# Patient Record
Sex: Female | Born: 1940 | Race: White | Hispanic: No | Smoking: Former smoker
Health system: Southern US, Community
[De-identification: ages and names within clinical notes are randomized; demographics above are authoritative.]

## PROBLEM LIST (undated history)

## (undated) DIAGNOSIS — N189 Chronic kidney disease, unspecified: Secondary | ICD-10-CM

## (undated) DIAGNOSIS — E119 Type 2 diabetes mellitus without complications: Secondary | ICD-10-CM

## (undated) DIAGNOSIS — E039 Hypothyroidism, unspecified: Secondary | ICD-10-CM

## (undated) DIAGNOSIS — I10 Essential (primary) hypertension: Secondary | ICD-10-CM

## (undated) DIAGNOSIS — I499 Cardiac arrhythmia, unspecified: Secondary | ICD-10-CM

## (undated) DIAGNOSIS — I509 Heart failure, unspecified: Secondary | ICD-10-CM

---

## 2011-12-01 DIAGNOSIS — L03119 Cellulitis of unspecified part of limb: Secondary | ICD-10-CM | POA: Diagnosis not present

## 2011-12-01 DIAGNOSIS — Z6841 Body Mass Index (BMI) 40.0 and over, adult: Secondary | ICD-10-CM | POA: Diagnosis not present

## 2011-12-01 DIAGNOSIS — L02419 Cutaneous abscess of limb, unspecified: Secondary | ICD-10-CM | POA: Diagnosis not present

## 2011-12-14 DIAGNOSIS — Z7901 Long term (current) use of anticoagulants: Secondary | ICD-10-CM | POA: Diagnosis not present

## 2012-01-03 DIAGNOSIS — Z1231 Encounter for screening mammogram for malignant neoplasm of breast: Secondary | ICD-10-CM | POA: Diagnosis not present

## 2012-01-16 DIAGNOSIS — Z79899 Other long term (current) drug therapy: Secondary | ICD-10-CM | POA: Diagnosis not present

## 2012-01-16 DIAGNOSIS — E119 Type 2 diabetes mellitus without complications: Secondary | ICD-10-CM | POA: Diagnosis not present

## 2012-01-16 DIAGNOSIS — I4891 Unspecified atrial fibrillation: Secondary | ICD-10-CM | POA: Diagnosis not present

## 2012-01-16 DIAGNOSIS — E785 Hyperlipidemia, unspecified: Secondary | ICD-10-CM | POA: Diagnosis not present

## 2012-01-16 DIAGNOSIS — I10 Essential (primary) hypertension: Secondary | ICD-10-CM | POA: Diagnosis not present

## 2012-01-16 DIAGNOSIS — Z6841 Body Mass Index (BMI) 40.0 and over, adult: Secondary | ICD-10-CM | POA: Diagnosis not present

## 2012-01-16 DIAGNOSIS — Z7901 Long term (current) use of anticoagulants: Secondary | ICD-10-CM | POA: Diagnosis not present

## 2012-01-16 DIAGNOSIS — IMO0001 Reserved for inherently not codable concepts without codable children: Secondary | ICD-10-CM | POA: Diagnosis not present

## 2012-02-16 DIAGNOSIS — Z7901 Long term (current) use of anticoagulants: Secondary | ICD-10-CM | POA: Diagnosis not present

## 2012-03-20 DIAGNOSIS — Z7901 Long term (current) use of anticoagulants: Secondary | ICD-10-CM | POA: Diagnosis not present

## 2012-04-19 DIAGNOSIS — D485 Neoplasm of uncertain behavior of skin: Secondary | ICD-10-CM | POA: Diagnosis not present

## 2012-04-19 DIAGNOSIS — D2310 Other benign neoplasm of skin of unspecified eyelid, including canthus: Secondary | ICD-10-CM | POA: Diagnosis not present

## 2012-04-20 DIAGNOSIS — D492 Neoplasm of unspecified behavior of bone, soft tissue, and skin: Secondary | ICD-10-CM | POA: Diagnosis not present

## 2012-04-20 DIAGNOSIS — D485 Neoplasm of uncertain behavior of skin: Secondary | ICD-10-CM | POA: Diagnosis not present

## 2012-04-23 DIAGNOSIS — Z7901 Long term (current) use of anticoagulants: Secondary | ICD-10-CM | POA: Diagnosis not present

## 2012-04-23 DIAGNOSIS — Z6841 Body Mass Index (BMI) 40.0 and over, adult: Secondary | ICD-10-CM | POA: Diagnosis not present

## 2012-04-23 DIAGNOSIS — I10 Essential (primary) hypertension: Secondary | ICD-10-CM | POA: Diagnosis not present

## 2012-04-23 DIAGNOSIS — E785 Hyperlipidemia, unspecified: Secondary | ICD-10-CM | POA: Diagnosis not present

## 2012-04-23 DIAGNOSIS — I4891 Unspecified atrial fibrillation: Secondary | ICD-10-CM | POA: Diagnosis not present

## 2012-05-14 DIAGNOSIS — M949 Disorder of cartilage, unspecified: Secondary | ICD-10-CM | POA: Diagnosis not present

## 2012-05-14 DIAGNOSIS — M25569 Pain in unspecified knee: Secondary | ICD-10-CM | POA: Diagnosis not present

## 2012-05-14 DIAGNOSIS — N951 Menopausal and female climacteric states: Secondary | ICD-10-CM | POA: Diagnosis not present

## 2012-05-14 DIAGNOSIS — M5137 Other intervertebral disc degeneration, lumbosacral region: Secondary | ICD-10-CM | POA: Diagnosis not present

## 2012-05-14 DIAGNOSIS — M47817 Spondylosis without myelopathy or radiculopathy, lumbosacral region: Secondary | ICD-10-CM | POA: Diagnosis not present

## 2012-05-14 DIAGNOSIS — M169 Osteoarthritis of hip, unspecified: Secondary | ICD-10-CM | POA: Diagnosis not present

## 2012-05-14 DIAGNOSIS — M899 Disorder of bone, unspecified: Secondary | ICD-10-CM | POA: Diagnosis not present

## 2012-05-25 DIAGNOSIS — R791 Abnormal coagulation profile: Secondary | ICD-10-CM | POA: Diagnosis not present

## 2012-05-29 DIAGNOSIS — M1610 Unilateral primary osteoarthritis, unspecified hip: Secondary | ICD-10-CM | POA: Diagnosis not present

## 2012-05-29 DIAGNOSIS — M5137 Other intervertebral disc degeneration, lumbosacral region: Secondary | ICD-10-CM | POA: Diagnosis not present

## 2012-05-29 DIAGNOSIS — M4310 Spondylolisthesis, site unspecified: Secondary | ICD-10-CM | POA: Diagnosis not present

## 2012-06-01 DIAGNOSIS — R791 Abnormal coagulation profile: Secondary | ICD-10-CM | POA: Diagnosis not present

## 2012-06-15 DIAGNOSIS — Z7901 Long term (current) use of anticoagulants: Secondary | ICD-10-CM | POA: Diagnosis not present

## 2012-07-12 DIAGNOSIS — H524 Presbyopia: Secondary | ICD-10-CM | POA: Diagnosis not present

## 2012-07-12 DIAGNOSIS — E119 Type 2 diabetes mellitus without complications: Secondary | ICD-10-CM | POA: Diagnosis not present

## 2012-07-17 DIAGNOSIS — Z7901 Long term (current) use of anticoagulants: Secondary | ICD-10-CM | POA: Diagnosis not present

## 2012-07-31 DIAGNOSIS — E785 Hyperlipidemia, unspecified: Secondary | ICD-10-CM | POA: Diagnosis not present

## 2012-07-31 DIAGNOSIS — I4891 Unspecified atrial fibrillation: Secondary | ICD-10-CM | POA: Diagnosis not present

## 2012-07-31 DIAGNOSIS — I10 Essential (primary) hypertension: Secondary | ICD-10-CM | POA: Diagnosis not present

## 2012-07-31 DIAGNOSIS — IMO0001 Reserved for inherently not codable concepts without codable children: Secondary | ICD-10-CM | POA: Diagnosis not present

## 2012-08-01 DIAGNOSIS — M109 Gout, unspecified: Secondary | ICD-10-CM | POA: Diagnosis not present

## 2012-08-01 DIAGNOSIS — I509 Heart failure, unspecified: Secondary | ICD-10-CM | POA: Diagnosis not present

## 2012-08-01 DIAGNOSIS — I4891 Unspecified atrial fibrillation: Secondary | ICD-10-CM | POA: Diagnosis not present

## 2012-08-09 DIAGNOSIS — I4891 Unspecified atrial fibrillation: Secondary | ICD-10-CM | POA: Diagnosis not present

## 2012-08-09 DIAGNOSIS — I509 Heart failure, unspecified: Secondary | ICD-10-CM | POA: Diagnosis not present

## 2012-08-09 DIAGNOSIS — M109 Gout, unspecified: Secondary | ICD-10-CM | POA: Diagnosis not present

## 2012-08-14 DIAGNOSIS — G4710 Hypersomnia, unspecified: Secondary | ICD-10-CM | POA: Diagnosis not present

## 2012-08-15 DIAGNOSIS — G4733 Obstructive sleep apnea (adult) (pediatric): Secondary | ICD-10-CM | POA: Diagnosis not present

## 2012-08-16 DIAGNOSIS — M109 Gout, unspecified: Secondary | ICD-10-CM | POA: Diagnosis not present

## 2012-08-16 DIAGNOSIS — I4891 Unspecified atrial fibrillation: Secondary | ICD-10-CM | POA: Diagnosis not present

## 2012-08-16 DIAGNOSIS — I509 Heart failure, unspecified: Secondary | ICD-10-CM | POA: Diagnosis not present

## 2012-08-17 DIAGNOSIS — M109 Gout, unspecified: Secondary | ICD-10-CM | POA: Diagnosis not present

## 2012-08-17 DIAGNOSIS — I4891 Unspecified atrial fibrillation: Secondary | ICD-10-CM | POA: Diagnosis not present

## 2012-08-17 DIAGNOSIS — I509 Heart failure, unspecified: Secondary | ICD-10-CM | POA: Diagnosis not present

## 2012-08-24 DIAGNOSIS — I4891 Unspecified atrial fibrillation: Secondary | ICD-10-CM | POA: Diagnosis not present

## 2012-08-24 DIAGNOSIS — M109 Gout, unspecified: Secondary | ICD-10-CM | POA: Diagnosis not present

## 2012-08-24 DIAGNOSIS — I509 Heart failure, unspecified: Secondary | ICD-10-CM | POA: Diagnosis not present

## 2012-08-28 DIAGNOSIS — G4730 Sleep apnea, unspecified: Secondary | ICD-10-CM | POA: Diagnosis not present

## 2012-08-28 DIAGNOSIS — G4710 Hypersomnia, unspecified: Secondary | ICD-10-CM | POA: Diagnosis not present

## 2012-08-29 DIAGNOSIS — G4733 Obstructive sleep apnea (adult) (pediatric): Secondary | ICD-10-CM | POA: Diagnosis not present

## 2012-08-31 DIAGNOSIS — M109 Gout, unspecified: Secondary | ICD-10-CM | POA: Diagnosis not present

## 2012-08-31 DIAGNOSIS — I4891 Unspecified atrial fibrillation: Secondary | ICD-10-CM | POA: Diagnosis not present

## 2012-08-31 DIAGNOSIS — I509 Heart failure, unspecified: Secondary | ICD-10-CM | POA: Diagnosis not present

## 2012-09-14 DIAGNOSIS — Z7901 Long term (current) use of anticoagulants: Secondary | ICD-10-CM | POA: Diagnosis not present

## 2012-10-16 DIAGNOSIS — Z7901 Long term (current) use of anticoagulants: Secondary | ICD-10-CM | POA: Diagnosis not present

## 2012-10-19 DIAGNOSIS — Z6841 Body Mass Index (BMI) 40.0 and over, adult: Secondary | ICD-10-CM | POA: Diagnosis not present

## 2012-10-19 DIAGNOSIS — M25569 Pain in unspecified knee: Secondary | ICD-10-CM | POA: Diagnosis not present

## 2012-10-22 DIAGNOSIS — M25569 Pain in unspecified knee: Secondary | ICD-10-CM | POA: Diagnosis not present

## 2012-10-25 DIAGNOSIS — M25569 Pain in unspecified knee: Secondary | ICD-10-CM | POA: Diagnosis not present

## 2012-10-30 DIAGNOSIS — M25569 Pain in unspecified knee: Secondary | ICD-10-CM | POA: Diagnosis not present

## 2012-11-05 DIAGNOSIS — I10 Essential (primary) hypertension: Secondary | ICD-10-CM | POA: Diagnosis not present

## 2012-11-05 DIAGNOSIS — Z6841 Body Mass Index (BMI) 40.0 and over, adult: Secondary | ICD-10-CM | POA: Diagnosis not present

## 2012-11-05 DIAGNOSIS — I4891 Unspecified atrial fibrillation: Secondary | ICD-10-CM | POA: Diagnosis not present

## 2012-11-05 DIAGNOSIS — IMO0001 Reserved for inherently not codable concepts without codable children: Secondary | ICD-10-CM | POA: Diagnosis not present

## 2012-11-05 DIAGNOSIS — E785 Hyperlipidemia, unspecified: Secondary | ICD-10-CM | POA: Diagnosis not present

## 2012-11-19 DIAGNOSIS — Z7901 Long term (current) use of anticoagulants: Secondary | ICD-10-CM | POA: Diagnosis not present

## 2012-12-21 DIAGNOSIS — Z7901 Long term (current) use of anticoagulants: Secondary | ICD-10-CM | POA: Diagnosis not present

## 2013-01-21 DIAGNOSIS — Z7901 Long term (current) use of anticoagulants: Secondary | ICD-10-CM | POA: Diagnosis not present

## 2013-02-11 DIAGNOSIS — I10 Essential (primary) hypertension: Secondary | ICD-10-CM | POA: Diagnosis not present

## 2013-02-11 DIAGNOSIS — E785 Hyperlipidemia, unspecified: Secondary | ICD-10-CM | POA: Diagnosis not present

## 2013-02-11 DIAGNOSIS — I4891 Unspecified atrial fibrillation: Secondary | ICD-10-CM | POA: Diagnosis not present

## 2013-02-11 DIAGNOSIS — Z79899 Other long term (current) drug therapy: Secondary | ICD-10-CM | POA: Diagnosis not present

## 2013-02-11 DIAGNOSIS — Z6841 Body Mass Index (BMI) 40.0 and over, adult: Secondary | ICD-10-CM | POA: Diagnosis not present

## 2013-02-22 DIAGNOSIS — Z1231 Encounter for screening mammogram for malignant neoplasm of breast: Secondary | ICD-10-CM | POA: Diagnosis not present

## 2013-02-22 DIAGNOSIS — Z7901 Long term (current) use of anticoagulants: Secondary | ICD-10-CM | POA: Diagnosis not present

## 2013-03-25 DIAGNOSIS — Z7901 Long term (current) use of anticoagulants: Secondary | ICD-10-CM | POA: Diagnosis not present

## 2013-04-29 DIAGNOSIS — Z7901 Long term (current) use of anticoagulants: Secondary | ICD-10-CM | POA: Diagnosis not present

## 2013-05-30 DIAGNOSIS — Z6841 Body Mass Index (BMI) 40.0 and over, adult: Secondary | ICD-10-CM | POA: Diagnosis not present

## 2013-05-30 DIAGNOSIS — I4891 Unspecified atrial fibrillation: Secondary | ICD-10-CM | POA: Diagnosis not present

## 2013-05-30 DIAGNOSIS — Z79899 Other long term (current) drug therapy: Secondary | ICD-10-CM | POA: Diagnosis not present

## 2013-05-30 DIAGNOSIS — Z7901 Long term (current) use of anticoagulants: Secondary | ICD-10-CM | POA: Diagnosis not present

## 2013-05-30 DIAGNOSIS — E785 Hyperlipidemia, unspecified: Secondary | ICD-10-CM | POA: Diagnosis not present

## 2013-05-30 DIAGNOSIS — I10 Essential (primary) hypertension: Secondary | ICD-10-CM | POA: Diagnosis not present

## 2013-07-01 DIAGNOSIS — Z7901 Long term (current) use of anticoagulants: Secondary | ICD-10-CM | POA: Diagnosis not present

## 2013-07-30 DIAGNOSIS — H524 Presbyopia: Secondary | ICD-10-CM | POA: Diagnosis not present

## 2013-07-30 DIAGNOSIS — H52229 Regular astigmatism, unspecified eye: Secondary | ICD-10-CM | POA: Diagnosis not present

## 2013-07-30 DIAGNOSIS — E119 Type 2 diabetes mellitus without complications: Secondary | ICD-10-CM | POA: Diagnosis not present

## 2013-07-30 DIAGNOSIS — H5200 Hypermetropia, unspecified eye: Secondary | ICD-10-CM | POA: Diagnosis not present

## 2013-08-05 DIAGNOSIS — Z7901 Long term (current) use of anticoagulants: Secondary | ICD-10-CM | POA: Diagnosis not present

## 2013-09-04 DIAGNOSIS — Z6841 Body Mass Index (BMI) 40.0 and over, adult: Secondary | ICD-10-CM | POA: Diagnosis not present

## 2013-09-04 DIAGNOSIS — Z7901 Long term (current) use of anticoagulants: Secondary | ICD-10-CM | POA: Diagnosis not present

## 2013-09-04 DIAGNOSIS — Z9181 History of falling: Secondary | ICD-10-CM | POA: Diagnosis not present

## 2013-09-04 DIAGNOSIS — I4891 Unspecified atrial fibrillation: Secondary | ICD-10-CM | POA: Diagnosis not present

## 2013-09-04 DIAGNOSIS — IMO0001 Reserved for inherently not codable concepts without codable children: Secondary | ICD-10-CM | POA: Diagnosis not present

## 2013-09-04 DIAGNOSIS — I10 Essential (primary) hypertension: Secondary | ICD-10-CM | POA: Diagnosis not present

## 2013-09-04 DIAGNOSIS — Z1331 Encounter for screening for depression: Secondary | ICD-10-CM | POA: Diagnosis not present

## 2013-09-04 DIAGNOSIS — Z79899 Other long term (current) drug therapy: Secondary | ICD-10-CM | POA: Diagnosis not present

## 2013-09-04 DIAGNOSIS — E785 Hyperlipidemia, unspecified: Secondary | ICD-10-CM | POA: Diagnosis not present

## 2013-09-18 DIAGNOSIS — J209 Acute bronchitis, unspecified: Secondary | ICD-10-CM | POA: Diagnosis not present

## 2013-09-18 DIAGNOSIS — R791 Abnormal coagulation profile: Secondary | ICD-10-CM | POA: Diagnosis not present

## 2013-09-18 DIAGNOSIS — Z6841 Body Mass Index (BMI) 40.0 and over, adult: Secondary | ICD-10-CM | POA: Diagnosis not present

## 2013-09-30 DIAGNOSIS — R791 Abnormal coagulation profile: Secondary | ICD-10-CM | POA: Diagnosis not present

## 2013-10-07 DIAGNOSIS — R791 Abnormal coagulation profile: Secondary | ICD-10-CM | POA: Diagnosis not present

## 2013-10-15 DIAGNOSIS — R791 Abnormal coagulation profile: Secondary | ICD-10-CM | POA: Diagnosis not present

## 2013-10-29 DIAGNOSIS — Z7901 Long term (current) use of anticoagulants: Secondary | ICD-10-CM | POA: Diagnosis not present

## 2013-11-08 DIAGNOSIS — N390 Urinary tract infection, site not specified: Secondary | ICD-10-CM | POA: Diagnosis not present

## 2013-11-29 DIAGNOSIS — Z7901 Long term (current) use of anticoagulants: Secondary | ICD-10-CM | POA: Diagnosis not present

## 2013-12-11 DIAGNOSIS — I4891 Unspecified atrial fibrillation: Secondary | ICD-10-CM | POA: Diagnosis not present

## 2013-12-11 DIAGNOSIS — Z6841 Body Mass Index (BMI) 40.0 and over, adult: Secondary | ICD-10-CM | POA: Diagnosis not present

## 2013-12-11 DIAGNOSIS — I10 Essential (primary) hypertension: Secondary | ICD-10-CM | POA: Diagnosis not present

## 2013-12-11 DIAGNOSIS — IMO0001 Reserved for inherently not codable concepts without codable children: Secondary | ICD-10-CM | POA: Diagnosis not present

## 2013-12-11 DIAGNOSIS — E785 Hyperlipidemia, unspecified: Secondary | ICD-10-CM | POA: Diagnosis not present

## 2013-12-11 DIAGNOSIS — Z79899 Other long term (current) drug therapy: Secondary | ICD-10-CM | POA: Diagnosis not present

## 2013-12-31 DIAGNOSIS — Z7901 Long term (current) use of anticoagulants: Secondary | ICD-10-CM | POA: Diagnosis not present

## 2014-01-29 DIAGNOSIS — Z7901 Long term (current) use of anticoagulants: Secondary | ICD-10-CM | POA: Diagnosis not present

## 2014-01-31 DIAGNOSIS — Z7901 Long term (current) use of anticoagulants: Secondary | ICD-10-CM | POA: Diagnosis not present

## 2014-02-07 DIAGNOSIS — Z7901 Long term (current) use of anticoagulants: Secondary | ICD-10-CM | POA: Diagnosis not present

## 2014-02-10 DIAGNOSIS — IMO0002 Reserved for concepts with insufficient information to code with codable children: Secondary | ICD-10-CM | POA: Diagnosis not present

## 2014-02-10 DIAGNOSIS — M1710 Unilateral primary osteoarthritis, unspecified knee: Secondary | ICD-10-CM | POA: Diagnosis not present

## 2014-02-10 DIAGNOSIS — M25569 Pain in unspecified knee: Secondary | ICD-10-CM | POA: Diagnosis not present

## 2014-02-10 DIAGNOSIS — Z6841 Body Mass Index (BMI) 40.0 and over, adult: Secondary | ICD-10-CM | POA: Diagnosis not present

## 2014-02-12 DIAGNOSIS — M6281 Muscle weakness (generalized): Secondary | ICD-10-CM | POA: Diagnosis not present

## 2014-02-12 DIAGNOSIS — M79609 Pain in unspecified limb: Secondary | ICD-10-CM | POA: Diagnosis not present

## 2014-02-14 DIAGNOSIS — M6281 Muscle weakness (generalized): Secondary | ICD-10-CM | POA: Diagnosis not present

## 2014-02-14 DIAGNOSIS — M79609 Pain in unspecified limb: Secondary | ICD-10-CM | POA: Diagnosis not present

## 2014-02-17 DIAGNOSIS — M79609 Pain in unspecified limb: Secondary | ICD-10-CM | POA: Diagnosis not present

## 2014-02-17 DIAGNOSIS — M6281 Muscle weakness (generalized): Secondary | ICD-10-CM | POA: Diagnosis not present

## 2014-02-19 DIAGNOSIS — M79609 Pain in unspecified limb: Secondary | ICD-10-CM | POA: Diagnosis not present

## 2014-02-19 DIAGNOSIS — M6281 Muscle weakness (generalized): Secondary | ICD-10-CM | POA: Diagnosis not present

## 2014-02-21 DIAGNOSIS — M6281 Muscle weakness (generalized): Secondary | ICD-10-CM | POA: Diagnosis not present

## 2014-02-21 DIAGNOSIS — M79609 Pain in unspecified limb: Secondary | ICD-10-CM | POA: Diagnosis not present

## 2014-02-24 DIAGNOSIS — Z7901 Long term (current) use of anticoagulants: Secondary | ICD-10-CM | POA: Diagnosis not present

## 2014-02-24 DIAGNOSIS — M6281 Muscle weakness (generalized): Secondary | ICD-10-CM | POA: Diagnosis not present

## 2014-02-24 DIAGNOSIS — M79609 Pain in unspecified limb: Secondary | ICD-10-CM | POA: Diagnosis not present

## 2014-03-13 DIAGNOSIS — Z6841 Body Mass Index (BMI) 40.0 and over, adult: Secondary | ICD-10-CM | POA: Diagnosis not present

## 2014-03-13 DIAGNOSIS — I10 Essential (primary) hypertension: Secondary | ICD-10-CM | POA: Diagnosis not present

## 2014-03-13 DIAGNOSIS — Z79899 Other long term (current) drug therapy: Secondary | ICD-10-CM | POA: Diagnosis not present

## 2014-03-13 DIAGNOSIS — E785 Hyperlipidemia, unspecified: Secondary | ICD-10-CM | POA: Diagnosis not present

## 2014-03-13 DIAGNOSIS — I4891 Unspecified atrial fibrillation: Secondary | ICD-10-CM | POA: Diagnosis not present

## 2014-03-13 DIAGNOSIS — IMO0001 Reserved for inherently not codable concepts without codable children: Secondary | ICD-10-CM | POA: Diagnosis not present

## 2014-03-27 DIAGNOSIS — Z7901 Long term (current) use of anticoagulants: Secondary | ICD-10-CM | POA: Diagnosis not present

## 2014-03-27 DIAGNOSIS — I10 Essential (primary) hypertension: Secondary | ICD-10-CM | POA: Diagnosis not present

## 2014-03-27 DIAGNOSIS — Z6841 Body Mass Index (BMI) 40.0 and over, adult: Secondary | ICD-10-CM | POA: Diagnosis not present

## 2014-03-27 DIAGNOSIS — E1129 Type 2 diabetes mellitus with other diabetic kidney complication: Secondary | ICD-10-CM | POA: Diagnosis not present

## 2014-03-27 DIAGNOSIS — N184 Chronic kidney disease, stage 4 (severe): Secondary | ICD-10-CM | POA: Diagnosis not present

## 2014-04-10 DIAGNOSIS — N184 Chronic kidney disease, stage 4 (severe): Secondary | ICD-10-CM | POA: Diagnosis not present

## 2014-04-29 DIAGNOSIS — Z1231 Encounter for screening mammogram for malignant neoplasm of breast: Secondary | ICD-10-CM | POA: Diagnosis not present

## 2014-04-29 DIAGNOSIS — Z7901 Long term (current) use of anticoagulants: Secondary | ICD-10-CM | POA: Diagnosis not present

## 2014-05-30 DIAGNOSIS — Z7901 Long term (current) use of anticoagulants: Secondary | ICD-10-CM | POA: Diagnosis not present

## 2014-07-01 DIAGNOSIS — Z7901 Long term (current) use of anticoagulants: Secondary | ICD-10-CM | POA: Diagnosis not present

## 2014-07-29 DIAGNOSIS — R300 Dysuria: Secondary | ICD-10-CM | POA: Diagnosis not present

## 2014-07-29 DIAGNOSIS — R3989 Other symptoms and signs involving the genitourinary system: Secondary | ICD-10-CM | POA: Diagnosis not present

## 2014-07-31 DIAGNOSIS — N390 Urinary tract infection, site not specified: Secondary | ICD-10-CM | POA: Diagnosis not present

## 2014-07-31 DIAGNOSIS — N318 Other neuromuscular dysfunction of bladder: Secondary | ICD-10-CM | POA: Diagnosis not present

## 2014-08-06 DIAGNOSIS — Z6841 Body Mass Index (BMI) 40.0 and over, adult: Secondary | ICD-10-CM | POA: Diagnosis not present

## 2014-08-06 DIAGNOSIS — E785 Hyperlipidemia, unspecified: Secondary | ICD-10-CM | POA: Diagnosis not present

## 2014-08-06 DIAGNOSIS — E1129 Type 2 diabetes mellitus with other diabetic kidney complication: Secondary | ICD-10-CM | POA: Diagnosis not present

## 2014-08-06 DIAGNOSIS — Z7901 Long term (current) use of anticoagulants: Secondary | ICD-10-CM | POA: Diagnosis not present

## 2014-08-06 DIAGNOSIS — Z79899 Other long term (current) drug therapy: Secondary | ICD-10-CM | POA: Diagnosis not present

## 2014-08-06 DIAGNOSIS — I10 Essential (primary) hypertension: Secondary | ICD-10-CM | POA: Diagnosis not present

## 2014-09-08 DIAGNOSIS — Z7901 Long term (current) use of anticoagulants: Secondary | ICD-10-CM | POA: Diagnosis not present

## 2014-10-03 DIAGNOSIS — M8580 Other specified disorders of bone density and structure, unspecified site: Secondary | ICD-10-CM | POA: Diagnosis not present

## 2014-10-03 DIAGNOSIS — M8589 Other specified disorders of bone density and structure, multiple sites: Secondary | ICD-10-CM | POA: Diagnosis not present

## 2014-10-13 DIAGNOSIS — Z7901 Long term (current) use of anticoagulants: Secondary | ICD-10-CM | POA: Diagnosis not present

## 2014-10-25 DIAGNOSIS — Z87891 Personal history of nicotine dependence: Secondary | ICD-10-CM | POA: Diagnosis not present

## 2014-10-25 DIAGNOSIS — K5900 Constipation, unspecified: Secondary | ICD-10-CM | POA: Diagnosis not present

## 2014-10-25 DIAGNOSIS — E162 Hypoglycemia, unspecified: Secondary | ICD-10-CM | POA: Diagnosis not present

## 2014-10-25 DIAGNOSIS — I509 Heart failure, unspecified: Secondary | ICD-10-CM | POA: Diagnosis not present

## 2014-10-25 DIAGNOSIS — I4891 Unspecified atrial fibrillation: Secondary | ICD-10-CM | POA: Diagnosis not present

## 2014-10-25 DIAGNOSIS — I10 Essential (primary) hypertension: Secondary | ICD-10-CM | POA: Diagnosis not present

## 2014-10-25 DIAGNOSIS — R410 Disorientation, unspecified: Secondary | ICD-10-CM | POA: Diagnosis not present

## 2014-10-25 DIAGNOSIS — F4489 Other dissociative and conversion disorders: Secondary | ICD-10-CM | POA: Diagnosis not present

## 2014-10-25 DIAGNOSIS — I6789 Other cerebrovascular disease: Secondary | ICD-10-CM | POA: Diagnosis not present

## 2014-10-25 DIAGNOSIS — E11649 Type 2 diabetes mellitus with hypoglycemia without coma: Secondary | ICD-10-CM | POA: Diagnosis not present

## 2014-10-25 DIAGNOSIS — R531 Weakness: Secondary | ICD-10-CM | POA: Diagnosis not present

## 2014-10-25 DIAGNOSIS — Z7901 Long term (current) use of anticoagulants: Secondary | ICD-10-CM | POA: Diagnosis not present

## 2014-10-25 DIAGNOSIS — R401 Stupor: Secondary | ICD-10-CM | POA: Diagnosis not present

## 2014-10-25 DIAGNOSIS — R0989 Other specified symptoms and signs involving the circulatory and respiratory systems: Secondary | ICD-10-CM | POA: Diagnosis not present

## 2014-10-25 DIAGNOSIS — R918 Other nonspecific abnormal finding of lung field: Secondary | ICD-10-CM | POA: Diagnosis not present

## 2014-10-25 DIAGNOSIS — E119 Type 2 diabetes mellitus without complications: Secondary | ICD-10-CM | POA: Diagnosis not present

## 2014-10-26 DIAGNOSIS — R918 Other nonspecific abnormal finding of lung field: Secondary | ICD-10-CM | POA: Diagnosis not present

## 2014-10-26 DIAGNOSIS — R401 Stupor: Secondary | ICD-10-CM | POA: Diagnosis not present

## 2014-10-26 DIAGNOSIS — E162 Hypoglycemia, unspecified: Secondary | ICD-10-CM | POA: Diagnosis not present

## 2014-10-26 DIAGNOSIS — R0989 Other specified symptoms and signs involving the circulatory and respiratory systems: Secondary | ICD-10-CM | POA: Diagnosis not present

## 2014-10-28 DIAGNOSIS — E1129 Type 2 diabetes mellitus with other diabetic kidney complication: Secondary | ICD-10-CM | POA: Diagnosis not present

## 2014-10-28 DIAGNOSIS — I10 Essential (primary) hypertension: Secondary | ICD-10-CM | POA: Diagnosis not present

## 2014-10-28 DIAGNOSIS — E11649 Type 2 diabetes mellitus with hypoglycemia without coma: Secondary | ICD-10-CM | POA: Diagnosis not present

## 2014-10-28 DIAGNOSIS — R269 Unspecified abnormalities of gait and mobility: Secondary | ICD-10-CM | POA: Diagnosis not present

## 2014-10-28 DIAGNOSIS — S81002A Unspecified open wound, left knee, initial encounter: Secondary | ICD-10-CM | POA: Diagnosis not present

## 2014-10-28 DIAGNOSIS — N184 Chronic kidney disease, stage 4 (severe): Secondary | ICD-10-CM | POA: Diagnosis not present

## 2014-10-28 DIAGNOSIS — Z6841 Body Mass Index (BMI) 40.0 and over, adult: Secondary | ICD-10-CM | POA: Diagnosis not present

## 2014-10-28 DIAGNOSIS — Z23 Encounter for immunization: Secondary | ICD-10-CM | POA: Diagnosis not present

## 2014-11-03 DIAGNOSIS — R2689 Other abnormalities of gait and mobility: Secondary | ICD-10-CM | POA: Diagnosis not present

## 2014-11-03 DIAGNOSIS — R269 Unspecified abnormalities of gait and mobility: Secondary | ICD-10-CM | POA: Diagnosis not present

## 2014-11-03 DIAGNOSIS — M6281 Muscle weakness (generalized): Secondary | ICD-10-CM | POA: Diagnosis not present

## 2014-11-11 DIAGNOSIS — I482 Chronic atrial fibrillation: Secondary | ICD-10-CM | POA: Diagnosis not present

## 2014-11-11 DIAGNOSIS — I10 Essential (primary) hypertension: Secondary | ICD-10-CM | POA: Diagnosis not present

## 2014-11-11 DIAGNOSIS — Z9181 History of falling: Secondary | ICD-10-CM | POA: Diagnosis not present

## 2014-11-11 DIAGNOSIS — E1129 Type 2 diabetes mellitus with other diabetic kidney complication: Secondary | ICD-10-CM | POA: Diagnosis not present

## 2014-11-11 DIAGNOSIS — Z7901 Long term (current) use of anticoagulants: Secondary | ICD-10-CM | POA: Diagnosis not present

## 2014-11-11 DIAGNOSIS — E785 Hyperlipidemia, unspecified: Secondary | ICD-10-CM | POA: Diagnosis not present

## 2014-11-11 DIAGNOSIS — Z79899 Other long term (current) drug therapy: Secondary | ICD-10-CM | POA: Diagnosis not present

## 2014-11-11 DIAGNOSIS — Z1389 Encounter for screening for other disorder: Secondary | ICD-10-CM | POA: Diagnosis not present

## 2014-11-24 DIAGNOSIS — R2689 Other abnormalities of gait and mobility: Secondary | ICD-10-CM | POA: Diagnosis not present

## 2014-11-24 DIAGNOSIS — M6281 Muscle weakness (generalized): Secondary | ICD-10-CM | POA: Diagnosis not present

## 2014-11-24 DIAGNOSIS — R269 Unspecified abnormalities of gait and mobility: Secondary | ICD-10-CM | POA: Diagnosis not present

## 2014-12-05 DIAGNOSIS — R269 Unspecified abnormalities of gait and mobility: Secondary | ICD-10-CM | POA: Diagnosis not present

## 2014-12-05 DIAGNOSIS — R2689 Other abnormalities of gait and mobility: Secondary | ICD-10-CM | POA: Diagnosis not present

## 2014-12-05 DIAGNOSIS — M6281 Muscle weakness (generalized): Secondary | ICD-10-CM | POA: Diagnosis not present

## 2014-12-08 DIAGNOSIS — M6281 Muscle weakness (generalized): Secondary | ICD-10-CM | POA: Diagnosis not present

## 2014-12-08 DIAGNOSIS — R269 Unspecified abnormalities of gait and mobility: Secondary | ICD-10-CM | POA: Diagnosis not present

## 2014-12-08 DIAGNOSIS — R2689 Other abnormalities of gait and mobility: Secondary | ICD-10-CM | POA: Diagnosis not present

## 2014-12-11 DIAGNOSIS — R791 Abnormal coagulation profile: Secondary | ICD-10-CM | POA: Diagnosis not present

## 2014-12-19 DIAGNOSIS — M6281 Muscle weakness (generalized): Secondary | ICD-10-CM | POA: Diagnosis not present

## 2014-12-19 DIAGNOSIS — R2689 Other abnormalities of gait and mobility: Secondary | ICD-10-CM | POA: Diagnosis not present

## 2014-12-19 DIAGNOSIS — R269 Unspecified abnormalities of gait and mobility: Secondary | ICD-10-CM | POA: Diagnosis not present

## 2014-12-22 DIAGNOSIS — M6281 Muscle weakness (generalized): Secondary | ICD-10-CM | POA: Diagnosis not present

## 2014-12-22 DIAGNOSIS — R2689 Other abnormalities of gait and mobility: Secondary | ICD-10-CM | POA: Diagnosis not present

## 2014-12-24 DIAGNOSIS — R2689 Other abnormalities of gait and mobility: Secondary | ICD-10-CM | POA: Diagnosis not present

## 2014-12-24 DIAGNOSIS — M6281 Muscle weakness (generalized): Secondary | ICD-10-CM | POA: Diagnosis not present

## 2014-12-29 DIAGNOSIS — M6281 Muscle weakness (generalized): Secondary | ICD-10-CM | POA: Diagnosis not present

## 2014-12-29 DIAGNOSIS — R2689 Other abnormalities of gait and mobility: Secondary | ICD-10-CM | POA: Diagnosis not present

## 2014-12-31 DIAGNOSIS — R2689 Other abnormalities of gait and mobility: Secondary | ICD-10-CM | POA: Diagnosis not present

## 2014-12-31 DIAGNOSIS — M6281 Muscle weakness (generalized): Secondary | ICD-10-CM | POA: Diagnosis not present

## 2015-01-12 DIAGNOSIS — R791 Abnormal coagulation profile: Secondary | ICD-10-CM | POA: Diagnosis not present

## 2015-01-14 DIAGNOSIS — R791 Abnormal coagulation profile: Secondary | ICD-10-CM | POA: Diagnosis not present

## 2015-01-20 DIAGNOSIS — R791 Abnormal coagulation profile: Secondary | ICD-10-CM | POA: Diagnosis not present

## 2015-01-27 DIAGNOSIS — R791 Abnormal coagulation profile: Secondary | ICD-10-CM | POA: Diagnosis not present

## 2015-02-12 DIAGNOSIS — E1129 Type 2 diabetes mellitus with other diabetic kidney complication: Secondary | ICD-10-CM | POA: Diagnosis not present

## 2015-02-12 DIAGNOSIS — Z79899 Other long term (current) drug therapy: Secondary | ICD-10-CM | POA: Diagnosis not present

## 2015-02-12 DIAGNOSIS — I482 Chronic atrial fibrillation: Secondary | ICD-10-CM | POA: Diagnosis not present

## 2015-02-12 DIAGNOSIS — I10 Essential (primary) hypertension: Secondary | ICD-10-CM | POA: Diagnosis not present

## 2015-02-12 DIAGNOSIS — M109 Gout, unspecified: Secondary | ICD-10-CM | POA: Diagnosis not present

## 2015-02-12 DIAGNOSIS — R791 Abnormal coagulation profile: Secondary | ICD-10-CM | POA: Diagnosis not present

## 2015-02-12 DIAGNOSIS — E785 Hyperlipidemia, unspecified: Secondary | ICD-10-CM | POA: Diagnosis not present

## 2015-02-12 DIAGNOSIS — Z6841 Body Mass Index (BMI) 40.0 and over, adult: Secondary | ICD-10-CM | POA: Diagnosis not present

## 2015-03-16 DIAGNOSIS — Z7901 Long term (current) use of anticoagulants: Secondary | ICD-10-CM | POA: Diagnosis not present

## 2015-03-31 DIAGNOSIS — E119 Type 2 diabetes mellitus without complications: Secondary | ICD-10-CM | POA: Diagnosis not present

## 2015-03-31 DIAGNOSIS — H524 Presbyopia: Secondary | ICD-10-CM | POA: Diagnosis not present

## 2015-03-31 DIAGNOSIS — H25813 Combined forms of age-related cataract, bilateral: Secondary | ICD-10-CM | POA: Diagnosis not present

## 2015-04-16 DIAGNOSIS — Z7901 Long term (current) use of anticoagulants: Secondary | ICD-10-CM | POA: Diagnosis not present

## 2015-05-21 DIAGNOSIS — E1129 Type 2 diabetes mellitus with other diabetic kidney complication: Secondary | ICD-10-CM | POA: Diagnosis not present

## 2015-05-21 DIAGNOSIS — Z6841 Body Mass Index (BMI) 40.0 and over, adult: Secondary | ICD-10-CM | POA: Diagnosis not present

## 2015-05-21 DIAGNOSIS — Z7901 Long term (current) use of anticoagulants: Secondary | ICD-10-CM | POA: Diagnosis not present

## 2015-05-21 DIAGNOSIS — E785 Hyperlipidemia, unspecified: Secondary | ICD-10-CM | POA: Diagnosis not present

## 2015-05-21 DIAGNOSIS — Z79899 Other long term (current) drug therapy: Secondary | ICD-10-CM | POA: Diagnosis not present

## 2015-05-21 DIAGNOSIS — I482 Chronic atrial fibrillation: Secondary | ICD-10-CM | POA: Diagnosis not present

## 2015-05-21 DIAGNOSIS — I10 Essential (primary) hypertension: Secondary | ICD-10-CM | POA: Diagnosis not present

## 2015-05-21 DIAGNOSIS — Z1389 Encounter for screening for other disorder: Secondary | ICD-10-CM | POA: Diagnosis not present

## 2015-05-21 DIAGNOSIS — M109 Gout, unspecified: Secondary | ICD-10-CM | POA: Diagnosis not present

## 2015-06-02 DIAGNOSIS — Z1231 Encounter for screening mammogram for malignant neoplasm of breast: Secondary | ICD-10-CM | POA: Diagnosis not present

## 2015-06-22 DIAGNOSIS — Z7901 Long term (current) use of anticoagulants: Secondary | ICD-10-CM | POA: Diagnosis not present

## 2015-07-23 DIAGNOSIS — Z7901 Long term (current) use of anticoagulants: Secondary | ICD-10-CM | POA: Diagnosis not present

## 2015-08-27 DIAGNOSIS — E1129 Type 2 diabetes mellitus with other diabetic kidney complication: Secondary | ICD-10-CM | POA: Diagnosis not present

## 2015-08-27 DIAGNOSIS — I482 Chronic atrial fibrillation: Secondary | ICD-10-CM | POA: Diagnosis not present

## 2015-08-27 DIAGNOSIS — E785 Hyperlipidemia, unspecified: Secondary | ICD-10-CM | POA: Diagnosis not present

## 2015-08-27 DIAGNOSIS — Z1389 Encounter for screening for other disorder: Secondary | ICD-10-CM | POA: Diagnosis not present

## 2015-08-27 DIAGNOSIS — Z7901 Long term (current) use of anticoagulants: Secondary | ICD-10-CM | POA: Diagnosis not present

## 2015-08-27 DIAGNOSIS — Z79899 Other long term (current) drug therapy: Secondary | ICD-10-CM | POA: Diagnosis not present

## 2015-08-27 DIAGNOSIS — M109 Gout, unspecified: Secondary | ICD-10-CM | POA: Diagnosis not present

## 2015-08-27 DIAGNOSIS — Z9181 History of falling: Secondary | ICD-10-CM | POA: Diagnosis not present

## 2015-08-27 DIAGNOSIS — I10 Essential (primary) hypertension: Secondary | ICD-10-CM | POA: Diagnosis not present

## 2015-08-28 DIAGNOSIS — R791 Abnormal coagulation profile: Secondary | ICD-10-CM | POA: Diagnosis not present

## 2015-08-31 DIAGNOSIS — R791 Abnormal coagulation profile: Secondary | ICD-10-CM | POA: Diagnosis not present

## 2015-09-04 DIAGNOSIS — Z7901 Long term (current) use of anticoagulants: Secondary | ICD-10-CM | POA: Diagnosis not present

## 2015-09-11 DIAGNOSIS — R791 Abnormal coagulation profile: Secondary | ICD-10-CM | POA: Diagnosis not present

## 2015-09-18 DIAGNOSIS — R791 Abnormal coagulation profile: Secondary | ICD-10-CM | POA: Diagnosis not present

## 2015-09-25 DIAGNOSIS — R791 Abnormal coagulation profile: Secondary | ICD-10-CM | POA: Diagnosis not present

## 2015-10-09 DIAGNOSIS — R791 Abnormal coagulation profile: Secondary | ICD-10-CM | POA: Diagnosis not present

## 2015-11-10 DIAGNOSIS — Z7901 Long term (current) use of anticoagulants: Secondary | ICD-10-CM | POA: Diagnosis not present

## 2015-12-11 DIAGNOSIS — Z7901 Long term (current) use of anticoagulants: Secondary | ICD-10-CM | POA: Diagnosis not present

## 2015-12-11 DIAGNOSIS — M109 Gout, unspecified: Secondary | ICD-10-CM | POA: Diagnosis not present

## 2015-12-11 DIAGNOSIS — Z6841 Body Mass Index (BMI) 40.0 and over, adult: Secondary | ICD-10-CM | POA: Diagnosis not present

## 2015-12-11 DIAGNOSIS — I482 Chronic atrial fibrillation: Secondary | ICD-10-CM | POA: Diagnosis not present

## 2015-12-11 DIAGNOSIS — I10 Essential (primary) hypertension: Secondary | ICD-10-CM | POA: Diagnosis not present

## 2015-12-11 DIAGNOSIS — Z79899 Other long term (current) drug therapy: Secondary | ICD-10-CM | POA: Diagnosis not present

## 2015-12-11 DIAGNOSIS — E1129 Type 2 diabetes mellitus with other diabetic kidney complication: Secondary | ICD-10-CM | POA: Diagnosis not present

## 2015-12-11 DIAGNOSIS — E785 Hyperlipidemia, unspecified: Secondary | ICD-10-CM | POA: Diagnosis not present

## 2016-01-11 DIAGNOSIS — Z7901 Long term (current) use of anticoagulants: Secondary | ICD-10-CM | POA: Diagnosis not present

## 2016-02-10 DIAGNOSIS — Z7901 Long term (current) use of anticoagulants: Secondary | ICD-10-CM | POA: Diagnosis not present

## 2016-02-18 DIAGNOSIS — M25561 Pain in right knee: Secondary | ICD-10-CM | POA: Diagnosis not present

## 2016-02-18 DIAGNOSIS — M25569 Pain in unspecified knee: Secondary | ICD-10-CM | POA: Diagnosis not present

## 2016-02-18 DIAGNOSIS — S8001XA Contusion of right knee, initial encounter: Secondary | ICD-10-CM | POA: Diagnosis not present

## 2016-02-18 DIAGNOSIS — S8991XA Unspecified injury of right lower leg, initial encounter: Secondary | ICD-10-CM | POA: Diagnosis not present

## 2016-02-18 DIAGNOSIS — S0990XA Unspecified injury of head, initial encounter: Secondary | ICD-10-CM | POA: Diagnosis not present

## 2016-02-18 DIAGNOSIS — S0083XA Contusion of other part of head, initial encounter: Secondary | ICD-10-CM | POA: Diagnosis not present

## 2016-03-14 DIAGNOSIS — I10 Essential (primary) hypertension: Secondary | ICD-10-CM | POA: Diagnosis not present

## 2016-03-14 DIAGNOSIS — I482 Chronic atrial fibrillation: Secondary | ICD-10-CM | POA: Diagnosis not present

## 2016-03-14 DIAGNOSIS — Z7901 Long term (current) use of anticoagulants: Secondary | ICD-10-CM | POA: Diagnosis not present

## 2016-03-14 DIAGNOSIS — M109 Gout, unspecified: Secondary | ICD-10-CM | POA: Diagnosis not present

## 2016-03-14 DIAGNOSIS — E785 Hyperlipidemia, unspecified: Secondary | ICD-10-CM | POA: Diagnosis not present

## 2016-03-14 DIAGNOSIS — Z79899 Other long term (current) drug therapy: Secondary | ICD-10-CM | POA: Diagnosis not present

## 2016-03-14 DIAGNOSIS — E1129 Type 2 diabetes mellitus with other diabetic kidney complication: Secondary | ICD-10-CM | POA: Diagnosis not present

## 2016-03-14 DIAGNOSIS — Z6841 Body Mass Index (BMI) 40.0 and over, adult: Secondary | ICD-10-CM | POA: Diagnosis not present

## 2016-04-14 DIAGNOSIS — Z7901 Long term (current) use of anticoagulants: Secondary | ICD-10-CM | POA: Diagnosis not present

## 2016-04-28 DIAGNOSIS — Z7901 Long term (current) use of anticoagulants: Secondary | ICD-10-CM | POA: Diagnosis not present

## 2016-04-29 DIAGNOSIS — E119 Type 2 diabetes mellitus without complications: Secondary | ICD-10-CM | POA: Diagnosis not present

## 2016-04-29 DIAGNOSIS — H25813 Combined forms of age-related cataract, bilateral: Secondary | ICD-10-CM | POA: Diagnosis not present

## 2016-05-30 DIAGNOSIS — Z7901 Long term (current) use of anticoagulants: Secondary | ICD-10-CM | POA: Diagnosis not present

## 2016-06-06 DIAGNOSIS — R791 Abnormal coagulation profile: Secondary | ICD-10-CM | POA: Diagnosis not present

## 2016-06-20 DIAGNOSIS — Z1231 Encounter for screening mammogram for malignant neoplasm of breast: Secondary | ICD-10-CM | POA: Diagnosis not present

## 2016-06-20 DIAGNOSIS — E785 Hyperlipidemia, unspecified: Secondary | ICD-10-CM | POA: Diagnosis not present

## 2016-06-20 DIAGNOSIS — Z6841 Body Mass Index (BMI) 40.0 and over, adult: Secondary | ICD-10-CM | POA: Diagnosis not present

## 2016-06-20 DIAGNOSIS — M5136 Other intervertebral disc degeneration, lumbar region: Secondary | ICD-10-CM | POA: Diagnosis not present

## 2016-06-20 DIAGNOSIS — Z79899 Other long term (current) drug therapy: Secondary | ICD-10-CM | POA: Diagnosis not present

## 2016-06-20 DIAGNOSIS — I482 Chronic atrial fibrillation: Secondary | ICD-10-CM | POA: Diagnosis not present

## 2016-06-20 DIAGNOSIS — E1129 Type 2 diabetes mellitus with other diabetic kidney complication: Secondary | ICD-10-CM | POA: Diagnosis not present

## 2016-06-20 DIAGNOSIS — R791 Abnormal coagulation profile: Secondary | ICD-10-CM | POA: Diagnosis not present

## 2016-06-20 DIAGNOSIS — M109 Gout, unspecified: Secondary | ICD-10-CM | POA: Diagnosis not present

## 2016-06-20 DIAGNOSIS — I10 Essential (primary) hypertension: Secondary | ICD-10-CM | POA: Diagnosis not present

## 2016-06-20 DIAGNOSIS — E1165 Type 2 diabetes mellitus with hyperglycemia: Secondary | ICD-10-CM | POA: Diagnosis not present

## 2016-06-27 DIAGNOSIS — Z1231 Encounter for screening mammogram for malignant neoplasm of breast: Secondary | ICD-10-CM | POA: Diagnosis not present

## 2016-07-22 DIAGNOSIS — Z7901 Long term (current) use of anticoagulants: Secondary | ICD-10-CM | POA: Diagnosis not present

## 2016-08-19 DIAGNOSIS — E860 Dehydration: Secondary | ICD-10-CM | POA: Diagnosis not present

## 2016-08-19 DIAGNOSIS — S82831A Other fracture of upper and lower end of right fibula, initial encounter for closed fracture: Secondary | ICD-10-CM | POA: Diagnosis not present

## 2016-08-19 DIAGNOSIS — R262 Difficulty in walking, not elsewhere classified: Secondary | ICD-10-CM | POA: Diagnosis not present

## 2016-08-19 DIAGNOSIS — S82891D Other fracture of right lower leg, subsequent encounter for closed fracture with routine healing: Secondary | ICD-10-CM | POA: Diagnosis not present

## 2016-08-20 DIAGNOSIS — K5900 Constipation, unspecified: Secondary | ICD-10-CM | POA: Diagnosis not present

## 2016-08-20 DIAGNOSIS — K5903 Drug induced constipation: Secondary | ICD-10-CM | POA: Diagnosis present

## 2016-08-20 DIAGNOSIS — R2681 Unsteadiness on feet: Secondary | ICD-10-CM | POA: Diagnosis not present

## 2016-08-20 DIAGNOSIS — S92909A Unspecified fracture of unspecified foot, initial encounter for closed fracture: Secondary | ICD-10-CM | POA: Diagnosis not present

## 2016-08-20 DIAGNOSIS — S82891D Other fracture of right lower leg, subsequent encounter for closed fracture with routine healing: Secondary | ICD-10-CM | POA: Diagnosis not present

## 2016-08-20 DIAGNOSIS — Y92013 Bedroom of single-family (private) house as the place of occurrence of the external cause: Secondary | ICD-10-CM | POA: Diagnosis not present

## 2016-08-20 DIAGNOSIS — I509 Heart failure, unspecified: Secondary | ICD-10-CM | POA: Diagnosis present

## 2016-08-20 DIAGNOSIS — Z7901 Long term (current) use of anticoagulants: Secondary | ICD-10-CM

## 2016-08-20 DIAGNOSIS — E1122 Type 2 diabetes mellitus with diabetic chronic kidney disease: Secondary | ICD-10-CM | POA: Diagnosis present

## 2016-08-20 DIAGNOSIS — S82409D Unspecified fracture of shaft of unspecified fibula, subsequent encounter for closed fracture with routine healing: Secondary | ICD-10-CM | POA: Diagnosis not present

## 2016-08-20 DIAGNOSIS — M25571 Pain in right ankle and joints of right foot: Secondary | ICD-10-CM | POA: Diagnosis not present

## 2016-08-20 DIAGNOSIS — R5383 Other fatigue: Secondary | ICD-10-CM | POA: Diagnosis not present

## 2016-08-20 DIAGNOSIS — Z794 Long term (current) use of insulin: Secondary | ICD-10-CM | POA: Diagnosis not present

## 2016-08-20 DIAGNOSIS — I482 Chronic atrial fibrillation: Secondary | ICD-10-CM | POA: Diagnosis not present

## 2016-08-20 DIAGNOSIS — E860 Dehydration: Secondary | ICD-10-CM | POA: Diagnosis not present

## 2016-08-20 DIAGNOSIS — W06XXXA Fall from bed, initial encounter: Secondary | ICD-10-CM | POA: Diagnosis not present

## 2016-08-20 DIAGNOSIS — Z79899 Other long term (current) drug therapy: Secondary | ICD-10-CM | POA: Diagnosis not present

## 2016-08-20 DIAGNOSIS — I130 Hypertensive heart and chronic kidney disease with heart failure and stage 1 through stage 4 chronic kidney disease, or unspecified chronic kidney disease: Secondary | ICD-10-CM | POA: Diagnosis present

## 2016-08-20 DIAGNOSIS — S82409A Unspecified fracture of shaft of unspecified fibula, initial encounter for closed fracture: Secondary | ICD-10-CM | POA: Diagnosis not present

## 2016-08-20 DIAGNOSIS — E119 Type 2 diabetes mellitus without complications: Secondary | ICD-10-CM | POA: Diagnosis not present

## 2016-08-20 DIAGNOSIS — R52 Pain, unspecified: Secondary | ICD-10-CM | POA: Diagnosis not present

## 2016-08-20 DIAGNOSIS — N183 Chronic kidney disease, stage 3 (moderate): Secondary | ICD-10-CM | POA: Diagnosis not present

## 2016-08-20 DIAGNOSIS — R278 Other lack of coordination: Secondary | ICD-10-CM | POA: Diagnosis not present

## 2016-08-20 DIAGNOSIS — Z6841 Body Mass Index (BMI) 40.0 and over, adult: Secondary | ICD-10-CM | POA: Diagnosis not present

## 2016-08-20 DIAGNOSIS — Z9181 History of falling: Secondary | ICD-10-CM | POA: Diagnosis not present

## 2016-08-20 DIAGNOSIS — R262 Difficulty in walking, not elsewhere classified: Secondary | ICD-10-CM | POA: Diagnosis not present

## 2016-08-20 DIAGNOSIS — J449 Chronic obstructive pulmonary disease, unspecified: Secondary | ICD-10-CM | POA: Diagnosis present

## 2016-08-20 DIAGNOSIS — M6281 Muscle weakness (generalized): Secondary | ICD-10-CM | POA: Diagnosis not present

## 2016-08-20 DIAGNOSIS — I10 Essential (primary) hypertension: Secondary | ICD-10-CM | POA: Diagnosis not present

## 2016-08-20 DIAGNOSIS — S8261XA Displaced fracture of lateral malleolus of right fibula, initial encounter for closed fracture: Secondary | ICD-10-CM | POA: Diagnosis not present

## 2016-08-20 DIAGNOSIS — Z87891 Personal history of nicotine dependence: Secondary | ICD-10-CM | POA: Diagnosis not present

## 2016-08-20 DIAGNOSIS — S82831A Other fracture of upper and lower end of right fibula, initial encounter for closed fracture: Secondary | ICD-10-CM | POA: Diagnosis not present

## 2016-08-23 DIAGNOSIS — B379 Candidiasis, unspecified: Secondary | ICD-10-CM | POA: Diagnosis not present

## 2016-08-23 DIAGNOSIS — S8261XA Displaced fracture of lateral malleolus of right fibula, initial encounter for closed fracture: Secondary | ICD-10-CM | POA: Diagnosis not present

## 2016-08-23 DIAGNOSIS — I129 Hypertensive chronic kidney disease with stage 1 through stage 4 chronic kidney disease, or unspecified chronic kidney disease: Secondary | ICD-10-CM | POA: Diagnosis not present

## 2016-08-23 DIAGNOSIS — E039 Hypothyroidism, unspecified: Secondary | ICD-10-CM | POA: Diagnosis present

## 2016-08-23 DIAGNOSIS — Z79899 Other long term (current) drug therapy: Secondary | ICD-10-CM | POA: Diagnosis not present

## 2016-08-23 DIAGNOSIS — A419 Sepsis, unspecified organism: Secondary | ICD-10-CM | POA: Diagnosis not present

## 2016-08-23 DIAGNOSIS — A0472 Enterocolitis due to Clostridium difficile, not specified as recurrent: Secondary | ICD-10-CM | POA: Diagnosis not present

## 2016-08-23 DIAGNOSIS — N390 Urinary tract infection, site not specified: Secondary | ICD-10-CM | POA: Diagnosis not present

## 2016-08-23 DIAGNOSIS — R509 Fever, unspecified: Secondary | ICD-10-CM | POA: Diagnosis not present

## 2016-08-23 DIAGNOSIS — Z9181 History of falling: Secondary | ICD-10-CM | POA: Diagnosis not present

## 2016-08-23 DIAGNOSIS — S92909A Unspecified fracture of unspecified foot, initial encounter for closed fracture: Secondary | ICD-10-CM | POA: Diagnosis not present

## 2016-08-23 DIAGNOSIS — R791 Abnormal coagulation profile: Secondary | ICD-10-CM | POA: Diagnosis present

## 2016-08-23 DIAGNOSIS — R262 Difficulty in walking, not elsewhere classified: Secondary | ICD-10-CM | POA: Diagnosis present

## 2016-08-23 DIAGNOSIS — E860 Dehydration: Secondary | ICD-10-CM | POA: Diagnosis not present

## 2016-08-23 DIAGNOSIS — E119 Type 2 diabetes mellitus without complications: Secondary | ICD-10-CM | POA: Diagnosis not present

## 2016-08-23 DIAGNOSIS — N289 Disorder of kidney and ureter, unspecified: Secondary | ICD-10-CM | POA: Diagnosis not present

## 2016-08-23 DIAGNOSIS — Z6841 Body Mass Index (BMI) 40.0 and over, adult: Secondary | ICD-10-CM | POA: Diagnosis not present

## 2016-08-23 DIAGNOSIS — R197 Diarrhea, unspecified: Secondary | ICD-10-CM | POA: Diagnosis not present

## 2016-08-23 DIAGNOSIS — R278 Other lack of coordination: Secondary | ICD-10-CM | POA: Diagnosis not present

## 2016-08-23 DIAGNOSIS — R2681 Unsteadiness on feet: Secondary | ICD-10-CM | POA: Diagnosis not present

## 2016-08-23 DIAGNOSIS — S82891D Other fracture of right lower leg, subsequent encounter for closed fracture with routine healing: Secondary | ICD-10-CM | POA: Diagnosis not present

## 2016-08-23 DIAGNOSIS — Z87891 Personal history of nicotine dependence: Secondary | ICD-10-CM | POA: Diagnosis not present

## 2016-08-23 DIAGNOSIS — S82409D Unspecified fracture of shaft of unspecified fibula, subsequent encounter for closed fracture with routine healing: Secondary | ICD-10-CM | POA: Diagnosis not present

## 2016-08-23 DIAGNOSIS — R112 Nausea with vomiting, unspecified: Secondary | ICD-10-CM | POA: Diagnosis not present

## 2016-08-23 DIAGNOSIS — B952 Enterococcus as the cause of diseases classified elsewhere: Secondary | ICD-10-CM | POA: Diagnosis present

## 2016-08-23 DIAGNOSIS — R531 Weakness: Secondary | ICD-10-CM | POA: Diagnosis not present

## 2016-08-23 DIAGNOSIS — Z7901 Long term (current) use of anticoagulants: Secondary | ICD-10-CM | POA: Diagnosis not present

## 2016-08-23 DIAGNOSIS — R5383 Other fatigue: Secondary | ICD-10-CM | POA: Diagnosis not present

## 2016-08-23 DIAGNOSIS — E785 Hyperlipidemia, unspecified: Secondary | ICD-10-CM | POA: Diagnosis not present

## 2016-08-23 DIAGNOSIS — S8261XD Displaced fracture of lateral malleolus of right fibula, subsequent encounter for closed fracture with routine healing: Secondary | ICD-10-CM | POA: Diagnosis not present

## 2016-08-23 DIAGNOSIS — G9341 Metabolic encephalopathy: Secondary | ICD-10-CM | POA: Diagnosis present

## 2016-08-23 DIAGNOSIS — Z794 Long term (current) use of insulin: Secondary | ICD-10-CM | POA: Diagnosis not present

## 2016-08-23 DIAGNOSIS — I10 Essential (primary) hypertension: Secondary | ICD-10-CM | POA: Diagnosis not present

## 2016-08-23 DIAGNOSIS — J449 Chronic obstructive pulmonary disease, unspecified: Secondary | ICD-10-CM | POA: Diagnosis present

## 2016-08-23 DIAGNOSIS — N183 Chronic kidney disease, stage 3 (moderate): Secondary | ICD-10-CM | POA: Diagnosis present

## 2016-08-23 DIAGNOSIS — I482 Chronic atrial fibrillation: Secondary | ICD-10-CM | POA: Diagnosis not present

## 2016-08-23 DIAGNOSIS — M6281 Muscle weakness (generalized): Secondary | ICD-10-CM | POA: Diagnosis not present

## 2016-08-23 DIAGNOSIS — M1990 Unspecified osteoarthritis, unspecified site: Secondary | ICD-10-CM | POA: Diagnosis not present

## 2016-08-23 DIAGNOSIS — N189 Chronic kidney disease, unspecified: Secondary | ICD-10-CM | POA: Diagnosis not present

## 2016-08-23 DIAGNOSIS — E1122 Type 2 diabetes mellitus with diabetic chronic kidney disease: Secondary | ICD-10-CM | POA: Diagnosis present

## 2016-08-23 DIAGNOSIS — N179 Acute kidney failure, unspecified: Secondary | ICD-10-CM | POA: Diagnosis present

## 2016-08-23 DIAGNOSIS — E559 Vitamin D deficiency, unspecified: Secondary | ICD-10-CM | POA: Diagnosis not present

## 2016-08-23 DIAGNOSIS — R0902 Hypoxemia: Secondary | ICD-10-CM | POA: Diagnosis not present

## 2016-08-23 DIAGNOSIS — M25571 Pain in right ankle and joints of right foot: Secondary | ICD-10-CM | POA: Diagnosis not present

## 2016-08-24 DIAGNOSIS — Z794 Long term (current) use of insulin: Secondary | ICD-10-CM | POA: Diagnosis not present

## 2016-08-24 DIAGNOSIS — I482 Chronic atrial fibrillation: Secondary | ICD-10-CM | POA: Diagnosis not present

## 2016-08-24 DIAGNOSIS — I10 Essential (primary) hypertension: Secondary | ICD-10-CM | POA: Diagnosis not present

## 2016-08-24 DIAGNOSIS — J449 Chronic obstructive pulmonary disease, unspecified: Secondary | ICD-10-CM | POA: Diagnosis not present

## 2016-08-25 DIAGNOSIS — Z7901 Long term (current) use of anticoagulants: Secondary | ICD-10-CM | POA: Diagnosis not present

## 2016-08-31 DIAGNOSIS — I482 Chronic atrial fibrillation: Secondary | ICD-10-CM | POA: Diagnosis not present

## 2016-08-31 DIAGNOSIS — Z9181 History of falling: Secondary | ICD-10-CM | POA: Diagnosis not present

## 2016-08-31 DIAGNOSIS — E559 Vitamin D deficiency, unspecified: Secondary | ICD-10-CM | POA: Diagnosis not present

## 2016-08-31 DIAGNOSIS — S82409D Unspecified fracture of shaft of unspecified fibula, subsequent encounter for closed fracture with routine healing: Secondary | ICD-10-CM | POA: Diagnosis not present

## 2016-08-31 DIAGNOSIS — E119 Type 2 diabetes mellitus without complications: Secondary | ICD-10-CM | POA: Diagnosis not present

## 2016-09-02 DIAGNOSIS — Z7901 Long term (current) use of anticoagulants: Secondary | ICD-10-CM | POA: Diagnosis not present

## 2016-09-09 DIAGNOSIS — B379 Candidiasis, unspecified: Secondary | ICD-10-CM | POA: Diagnosis not present

## 2016-09-09 DIAGNOSIS — S8261XA Displaced fracture of lateral malleolus of right fibula, initial encounter for closed fracture: Secondary | ICD-10-CM | POA: Diagnosis not present

## 2016-09-09 DIAGNOSIS — I482 Chronic atrial fibrillation: Secondary | ICD-10-CM | POA: Diagnosis not present

## 2016-09-09 DIAGNOSIS — S82409D Unspecified fracture of shaft of unspecified fibula, subsequent encounter for closed fracture with routine healing: Secondary | ICD-10-CM | POA: Diagnosis not present

## 2016-09-15 DIAGNOSIS — Z79899 Other long term (current) drug therapy: Secondary | ICD-10-CM | POA: Diagnosis not present

## 2016-09-22 DIAGNOSIS — N390 Urinary tract infection, site not specified: Secondary | ICD-10-CM | POA: Diagnosis not present

## 2016-09-23 DIAGNOSIS — N390 Urinary tract infection, site not specified: Secondary | ICD-10-CM | POA: Diagnosis not present

## 2016-09-23 DIAGNOSIS — R509 Fever, unspecified: Secondary | ICD-10-CM | POA: Diagnosis not present

## 2016-09-24 DIAGNOSIS — Z79899 Other long term (current) drug therapy: Secondary | ICD-10-CM | POA: Diagnosis not present

## 2016-09-26 DIAGNOSIS — Z79899 Other long term (current) drug therapy: Secondary | ICD-10-CM | POA: Diagnosis not present

## 2016-09-26 DIAGNOSIS — I482 Chronic atrial fibrillation: Secondary | ICD-10-CM | POA: Diagnosis not present

## 2016-10-05 DIAGNOSIS — I482 Chronic atrial fibrillation: Secondary | ICD-10-CM | POA: Diagnosis not present

## 2016-10-07 DIAGNOSIS — S8261XD Displaced fracture of lateral malleolus of right fibula, subsequent encounter for closed fracture with routine healing: Secondary | ICD-10-CM | POA: Diagnosis not present

## 2016-10-07 DIAGNOSIS — Z7901 Long term (current) use of anticoagulants: Secondary | ICD-10-CM | POA: Diagnosis not present

## 2016-10-09 DIAGNOSIS — I482 Chronic atrial fibrillation: Secondary | ICD-10-CM | POA: Diagnosis not present

## 2016-10-09 DIAGNOSIS — E785 Hyperlipidemia, unspecified: Secondary | ICD-10-CM | POA: Diagnosis not present

## 2016-10-09 DIAGNOSIS — I10 Essential (primary) hypertension: Secondary | ICD-10-CM | POA: Diagnosis not present

## 2016-10-09 DIAGNOSIS — M1990 Unspecified osteoarthritis, unspecified site: Secondary | ICD-10-CM | POA: Diagnosis not present

## 2016-10-21 DIAGNOSIS — R197 Diarrhea, unspecified: Secondary | ICD-10-CM | POA: Diagnosis not present

## 2016-10-25 DIAGNOSIS — A0472 Enterocolitis due to Clostridium difficile, not specified as recurrent: Secondary | ICD-10-CM | POA: Diagnosis present

## 2016-10-25 DIAGNOSIS — N390 Urinary tract infection, site not specified: Secondary | ICD-10-CM | POA: Diagnosis present

## 2016-10-25 DIAGNOSIS — Z79899 Other long term (current) drug therapy: Secondary | ICD-10-CM | POA: Diagnosis not present

## 2016-10-25 DIAGNOSIS — B952 Enterococcus as the cause of diseases classified elsewhere: Secondary | ICD-10-CM | POA: Diagnosis present

## 2016-10-25 DIAGNOSIS — Z6841 Body Mass Index (BMI) 40.0 and over, adult: Secondary | ICD-10-CM | POA: Diagnosis not present

## 2016-10-25 DIAGNOSIS — Z7901 Long term (current) use of anticoagulants: Secondary | ICD-10-CM

## 2016-10-25 DIAGNOSIS — I499 Cardiac arrhythmia, unspecified: Secondary | ICD-10-CM | POA: Diagnosis not present

## 2016-10-25 DIAGNOSIS — Z87891 Personal history of nicotine dependence: Secondary | ICD-10-CM | POA: Diagnosis not present

## 2016-10-25 DIAGNOSIS — E039 Hypothyroidism, unspecified: Secondary | ICD-10-CM | POA: Diagnosis present

## 2016-10-25 DIAGNOSIS — R0902 Hypoxemia: Secondary | ICD-10-CM | POA: Diagnosis not present

## 2016-10-25 DIAGNOSIS — N179 Acute kidney failure, unspecified: Secondary | ICD-10-CM | POA: Diagnosis not present

## 2016-10-25 DIAGNOSIS — R262 Difficulty in walking, not elsewhere classified: Secondary | ICD-10-CM | POA: Diagnosis present

## 2016-10-25 DIAGNOSIS — E1122 Type 2 diabetes mellitus with diabetic chronic kidney disease: Secondary | ICD-10-CM | POA: Diagnosis not present

## 2016-10-25 DIAGNOSIS — I482 Chronic atrial fibrillation: Secondary | ICD-10-CM | POA: Diagnosis not present

## 2016-10-25 DIAGNOSIS — R531 Weakness: Secondary | ICD-10-CM | POA: Diagnosis not present

## 2016-10-25 DIAGNOSIS — I129 Hypertensive chronic kidney disease with stage 1 through stage 4 chronic kidney disease, or unspecified chronic kidney disease: Secondary | ICD-10-CM | POA: Diagnosis not present

## 2016-10-25 DIAGNOSIS — S82891D Other fracture of right lower leg, subsequent encounter for closed fracture with routine healing: Secondary | ICD-10-CM | POA: Diagnosis not present

## 2016-10-25 DIAGNOSIS — E119 Type 2 diabetes mellitus without complications: Secondary | ICD-10-CM

## 2016-10-25 DIAGNOSIS — N189 Chronic kidney disease, unspecified: Secondary | ICD-10-CM | POA: Diagnosis not present

## 2016-10-25 DIAGNOSIS — E860 Dehydration: Secondary | ICD-10-CM | POA: Diagnosis not present

## 2016-10-25 DIAGNOSIS — N289 Disorder of kidney and ureter, unspecified: Secondary | ICD-10-CM | POA: Diagnosis not present

## 2016-10-25 DIAGNOSIS — S82891A Other fracture of right lower leg, initial encounter for closed fracture: Secondary | ICD-10-CM

## 2016-10-25 DIAGNOSIS — R791 Abnormal coagulation profile: Secondary | ICD-10-CM

## 2016-10-25 DIAGNOSIS — Z794 Long term (current) use of insulin: Secondary | ICD-10-CM | POA: Diagnosis not present

## 2016-10-25 DIAGNOSIS — G9341 Metabolic encephalopathy: Secondary | ICD-10-CM | POA: Diagnosis not present

## 2016-10-25 DIAGNOSIS — I10 Essential (primary) hypertension: Secondary | ICD-10-CM

## 2016-10-25 DIAGNOSIS — A419 Sepsis, unspecified organism: Secondary | ICD-10-CM | POA: Diagnosis not present

## 2016-10-25 DIAGNOSIS — J449 Chronic obstructive pulmonary disease, unspecified: Secondary | ICD-10-CM

## 2016-10-25 DIAGNOSIS — R112 Nausea with vomiting, unspecified: Secondary | ICD-10-CM | POA: Diagnosis not present

## 2016-10-25 DIAGNOSIS — N183 Chronic kidney disease, stage 3 (moderate): Secondary | ICD-10-CM | POA: Diagnosis present

## 2016-10-26 DIAGNOSIS — I499 Cardiac arrhythmia, unspecified: Secondary | ICD-10-CM | POA: Diagnosis not present

## 2016-10-27 DIAGNOSIS — B952 Enterococcus as the cause of diseases classified elsewhere: Secondary | ICD-10-CM

## 2016-10-28 ENCOUNTER — Inpatient Hospital Stay (HOSPITAL_COMMUNITY)
Admission: EM | Admit: 2016-10-28 | Discharge: 2016-11-03 | DRG: 682 | Disposition: A | Discharge status: Skilled Nursing Facility | Payer: Medicare Other | Source: Other Acute Inpatient Hospital | Attending: Family Medicine | Admitting: Family Medicine

## 2016-10-28 ENCOUNTER — Encounter (HOSPITAL_COMMUNITY): Payer: Self-pay | Admitting: Family Medicine

## 2016-10-28 DIAGNOSIS — E860 Dehydration: Secondary | ICD-10-CM | POA: Diagnosis present

## 2016-10-28 DIAGNOSIS — R112 Nausea with vomiting, unspecified: Secondary | ICD-10-CM | POA: Diagnosis not present

## 2016-10-28 DIAGNOSIS — Z79899 Other long term (current) drug therapy: Secondary | ICD-10-CM

## 2016-10-28 DIAGNOSIS — I959 Hypotension, unspecified: Secondary | ICD-10-CM | POA: Diagnosis present

## 2016-10-28 DIAGNOSIS — R001 Bradycardia, unspecified: Secondary | ICD-10-CM | POA: Diagnosis present

## 2016-10-28 DIAGNOSIS — I509 Heart failure, unspecified: Secondary | ICD-10-CM | POA: Diagnosis not present

## 2016-10-28 DIAGNOSIS — Z7901 Long term (current) use of anticoagulants: Secondary | ICD-10-CM

## 2016-10-28 DIAGNOSIS — E1129 Type 2 diabetes mellitus with other diabetic kidney complication: Secondary | ICD-10-CM | POA: Diagnosis not present

## 2016-10-28 DIAGNOSIS — Z66 Do not resuscitate: Secondary | ICD-10-CM | POA: Diagnosis present

## 2016-10-28 DIAGNOSIS — Z794 Long term (current) use of insulin: Secondary | ICD-10-CM | POA: Diagnosis not present

## 2016-10-28 DIAGNOSIS — R2689 Other abnormalities of gait and mobility: Secondary | ICD-10-CM | POA: Diagnosis not present

## 2016-10-28 DIAGNOSIS — G9341 Metabolic encephalopathy: Secondary | ICD-10-CM | POA: Diagnosis present

## 2016-10-28 DIAGNOSIS — N289 Disorder of kidney and ureter, unspecified: Secondary | ICD-10-CM | POA: Diagnosis not present

## 2016-10-28 DIAGNOSIS — Z87891 Personal history of nicotine dependence: Secondary | ICD-10-CM

## 2016-10-28 DIAGNOSIS — E1122 Type 2 diabetes mellitus with diabetic chronic kidney disease: Secondary | ICD-10-CM

## 2016-10-28 DIAGNOSIS — E46 Unspecified protein-calorie malnutrition: Secondary | ICD-10-CM | POA: Diagnosis present

## 2016-10-28 DIAGNOSIS — A0472 Enterocolitis due to Clostridium difficile, not specified as recurrent: Secondary | ICD-10-CM | POA: Diagnosis not present

## 2016-10-28 DIAGNOSIS — I129 Hypertensive chronic kidney disease with stage 1 through stage 4 chronic kidney disease, or unspecified chronic kidney disease: Secondary | ICD-10-CM | POA: Diagnosis not present

## 2016-10-28 DIAGNOSIS — N390 Urinary tract infection, site not specified: Secondary | ICD-10-CM | POA: Diagnosis present

## 2016-10-28 DIAGNOSIS — N183 Chronic kidney disease, stage 3 (moderate): Secondary | ICD-10-CM | POA: Diagnosis present

## 2016-10-28 DIAGNOSIS — B952 Enterococcus as the cause of diseases classified elsewhere: Secondary | ICD-10-CM | POA: Diagnosis present

## 2016-10-28 DIAGNOSIS — T148XXS Other injury of unspecified body region, sequela: Secondary | ICD-10-CM | POA: Diagnosis not present

## 2016-10-28 DIAGNOSIS — I130 Hypertensive heart and chronic kidney disease with heart failure and stage 1 through stage 4 chronic kidney disease, or unspecified chronic kidney disease: Secondary | ICD-10-CM | POA: Diagnosis present

## 2016-10-28 DIAGNOSIS — E861 Hypovolemia: Secondary | ICD-10-CM | POA: Diagnosis present

## 2016-10-28 DIAGNOSIS — N179 Acute kidney failure, unspecified: Secondary | ICD-10-CM | POA: Diagnosis present

## 2016-10-28 DIAGNOSIS — S82891D Other fracture of right lower leg, subsequent encounter for closed fracture with routine healing: Secondary | ICD-10-CM

## 2016-10-28 DIAGNOSIS — E872 Acidosis: Secondary | ICD-10-CM | POA: Diagnosis present

## 2016-10-28 DIAGNOSIS — N170 Acute kidney failure with tubular necrosis: Principal | ICD-10-CM | POA: Diagnosis present

## 2016-10-28 DIAGNOSIS — I5031 Acute diastolic (congestive) heart failure: Secondary | ICD-10-CM | POA: Diagnosis present

## 2016-10-28 DIAGNOSIS — I5030 Unspecified diastolic (congestive) heart failure: Secondary | ICD-10-CM | POA: Diagnosis present

## 2016-10-28 DIAGNOSIS — E039 Hypothyroidism, unspecified: Secondary | ICD-10-CM | POA: Diagnosis present

## 2016-10-28 DIAGNOSIS — M6281 Muscle weakness (generalized): Secondary | ICD-10-CM | POA: Diagnosis not present

## 2016-10-28 DIAGNOSIS — E876 Hypokalemia: Secondary | ICD-10-CM | POA: Diagnosis not present

## 2016-10-28 DIAGNOSIS — I482 Chronic atrial fibrillation, unspecified: Secondary | ICD-10-CM | POA: Diagnosis present

## 2016-10-28 DIAGNOSIS — E669 Obesity, unspecified: Secondary | ICD-10-CM | POA: Diagnosis not present

## 2016-10-28 DIAGNOSIS — N189 Chronic kidney disease, unspecified: Secondary | ICD-10-CM | POA: Diagnosis not present

## 2016-10-28 DIAGNOSIS — Z4789 Encounter for other orthopedic aftercare: Secondary | ICD-10-CM | POA: Diagnosis not present

## 2016-10-28 DIAGNOSIS — E11649 Type 2 diabetes mellitus with hypoglycemia without coma: Secondary | ICD-10-CM | POA: Diagnosis present

## 2016-10-28 DIAGNOSIS — L8990 Pressure ulcer of unspecified site, unspecified stage: Secondary | ICD-10-CM | POA: Diagnosis present

## 2016-10-28 DIAGNOSIS — I10 Essential (primary) hypertension: Secondary | ICD-10-CM | POA: Diagnosis not present

## 2016-10-28 DIAGNOSIS — I4891 Unspecified atrial fibrillation: Secondary | ICD-10-CM | POA: Diagnosis not present

## 2016-10-28 DIAGNOSIS — S82899S Other fracture of unspecified lower leg, sequela: Secondary | ICD-10-CM | POA: Diagnosis not present

## 2016-10-28 DIAGNOSIS — Z6841 Body Mass Index (BMI) 40.0 and over, adult: Secondary | ICD-10-CM | POA: Diagnosis not present

## 2016-10-28 DIAGNOSIS — Z741 Need for assistance with personal care: Secondary | ICD-10-CM | POA: Diagnosis not present

## 2016-10-28 HISTORY — DX: Heart failure, unspecified: I50.9

## 2016-10-28 HISTORY — DX: Hypothyroidism, unspecified: E03.9

## 2016-10-28 HISTORY — DX: Chronic kidney disease, unspecified: N18.9

## 2016-10-28 HISTORY — DX: Type 2 diabetes mellitus without complications: E11.9

## 2016-10-28 HISTORY — DX: Cardiac arrhythmia, unspecified: I49.9

## 2016-10-28 HISTORY — DX: Essential (primary) hypertension: I10

## 2016-10-28 MED ORDER — ONDANSETRON HCL 4 MG PO TABS: 4.0000 mg | ORAL_TABLET | Freq: Four times a day (QID) | ORAL | Status: DC | PRN

## 2016-10-28 MED ORDER — OXYCODONE HCL 5 MG PO TABS: 5.0000 mg | ORAL_TABLET | Freq: Four times a day (QID) | ORAL | Status: DC | PRN

## 2016-10-28 MED ORDER — SODIUM CHLORIDE 0.9% FLUSH
3.0000 mL | Freq: Two times a day (BID) | INTRAVENOUS | Status: DC
  Administered 2016-10-29 – 2016-11-02 (×9): 3 mL via INTRAVENOUS

## 2016-10-28 MED ORDER — INSULIN ASPART 100 UNIT/ML ~~LOC~~ SOLN
0.0000 [IU] | Freq: Three times a day (TID) | SUBCUTANEOUS | Status: DC
  Administered 2016-10-30 – 2016-11-01 (×5): 1 [IU] via SUBCUTANEOUS

## 2016-10-28 MED ORDER — INSULIN GLARGINE 100 UNIT/ML ~~LOC~~ SOLN
30.0000 [IU] | Freq: Every day | SUBCUTANEOUS | Status: DC
  Administered 2016-10-29 – 2016-10-31 (×3): 30 [IU] via SUBCUTANEOUS
  Administered 2016-11-01: 20 [IU] via SUBCUTANEOUS
  Filled 2016-10-28 (×5): qty 0.3

## 2016-10-28 MED ORDER — DIGOXIN 125 MCG PO TABS
0.1250 mg | ORAL_TABLET | Freq: Every day | ORAL | Status: DC
  Filled 2016-10-28 (×2): qty 1

## 2016-10-28 MED ORDER — ONDANSETRON HCL 4 MG/2ML IJ SOLN: 4.0000 mg | Freq: Four times a day (QID) | INTRAMUSCULAR | Status: DC | PRN

## 2016-10-28 MED ORDER — SODIUM CHLORIDE 0.9 % IV SOLN
INTRAVENOUS | Status: DC
  Administered 2016-10-29: 05:00:00 via INTRAVENOUS

## 2016-10-28 MED ORDER — SODIUM CHLORIDE 0.9 % IV BOLUS (SEPSIS)
500.0000 mL | Freq: Once | INTRAVENOUS | Status: AC
  Administered 2016-10-29: 500 mL via INTRAVENOUS

## 2016-10-28 MED ORDER — INSULIN ASPART 100 UNIT/ML ~~LOC~~ SOLN: 0.0000 [IU] | Freq: Every day | SUBCUTANEOUS | Status: DC

## 2016-10-28 MED ORDER — LEVOTHYROXINE SODIUM 25 MCG PO TABS
25.0000 ug | ORAL_TABLET | Freq: Every day | ORAL | Status: DC
  Administered 2016-10-29 – 2016-11-03 (×6): 25 ug via ORAL
  Filled 2016-10-28 (×6): qty 1

## 2016-10-28 MED ORDER — SODIUM CHLORIDE 0.9 % IV SOLN
1.0000 g | Freq: Two times a day (BID) | INTRAVENOUS | Status: DC
  Administered 2016-10-29 – 2016-11-03 (×11): 1 g via INTRAVENOUS
  Filled 2016-10-28 (×14): qty 1000

## 2016-10-28 MED ORDER — ACETAMINOPHEN 650 MG RE SUPP: 650.0000 mg | Freq: Four times a day (QID) | RECTAL | Status: DC | PRN

## 2016-10-28 MED ORDER — VANCOMYCIN 50 MG/ML ORAL SOLUTION
500.0000 mg | Freq: Four times a day (QID) | ORAL | Status: DC
  Administered 2016-10-29 – 2016-11-03 (×24): 500 mg via ORAL
  Filled 2016-10-28 (×28): qty 10

## 2016-10-28 MED ORDER — ACETAMINOPHEN 325 MG PO TABS: 650.0000 mg | ORAL_TABLET | Freq: Four times a day (QID) | ORAL | Status: DC | PRN

## 2016-10-28 MED ORDER — PRAVASTATIN SODIUM 40 MG PO TABS
40.0000 mg | ORAL_TABLET | Freq: Every day | ORAL | Status: DC
  Administered 2016-10-29 – 2016-11-03 (×6): 40 mg via ORAL
  Filled 2016-10-28 (×6): qty 1

## 2016-10-28 NOTE — H&P (Signed)
History and Physical  Patient Name: Danielle Woodard     [id number]    DOB: 25-Aug-1941    DOA: 10/28/2016 PCP: No primary care provider on file.   Patient coming from: SNF --[facility] 3d --> [facility]  Chief Complaint: AKI, C diff colitis  HPI: Danielle Woodard is a 75 y.o. female with a past medical history significant for Afib on warfarin, IDDM, HTN, CKD III baseline Cr 1.3, hypothyroidism, and CHF unknown EF who was transferred to [facility] for AKI.  The patient underwent surgical repair of a right ankle fracture 3 weeks ago, uneventful post-op course, discharged to SNF for rehab.  Now in last 1-2 weeks, has had progressive watery diarrhea until three days ago was brought to [facility] ER for vomiting and new confusion.  There she was found to have WBC 26K, Cr 4.1 (baseline 1.3), Na 130, INR 9.5, C diff toxin positive, T 97.9, HR 86, BP 103/52, lactic acid 1.3.  Was given Flagyl for Cdiff and fluids for AKI and admitted to [facility] for further management.    Outside hospital course: -Cr trend 4.1 --> 4.1 --> 4.6 --> 4.2 today, oliguric -WBC trend 26K --> 24.5K --> 25K --> 18.8K today -Urine culture grew enterococcus and so she was started on ampicillin there -She was transitioned to oral vancomycin 125 mg QID on HD 2 there -FeNA was 0.5 and UA was bland -Today, because of persistent renal failure, the case was discussed with Nephrology at [facility] who agreed to evaluate the patient on transfer, and [facility] accepted for renal failure           On arrival of [facility] to pick up the patient tonight, the patient was hypotensive to 70/40 by Dynamap and 80/50 manually.  [facility] team discussed with on-call hospitalist here who recommended PCCM consult before transfer.  PCCM recommended discharging hospitalist re-evaluation, although by this time the patient was already en route.  The patient arrived on the floor on NS 150 cc/hr and with BP 90/50.  I requested fluid bolus and stepdown transfer  and discussed with on call hospitalist at [facility] who was unfamiliar with patient (not discharging physician), but who reviewed chart and reported BP range typically 99991111 systolic there over last 3 days with exception of overnight SBP 70 once on 12/6.           ROS: Review of Systems  Constitutional: Positive for malaise/fatigue. Negative for chills and fever.  Gastrointestinal: Positive for abdominal pain (lower, vague) and diarrhea (resolving). Negative for nausea and vomiting.  Neurological: Positive for weakness.  All other systems reviewed and are negative.         Past Medical History:  Diagnosis Date  . CHF (congestive heart failure) ([place])   . Chronic kidney disease   . Diabetes mellitus without complication ([place])   . Dysrhythmia   . Hypertension   . Hypothyroidism     History reviewed. No pertinent surgical history.  Social History: Patient lives alone usually.  The patient walks unassisted.  She no longer drives.  She is a former smoker.    Not on File  Family history: Mother and father deceased. Mother was healthy.  Father health unknown.  Patient unable to remember other details of FH.  Prior to Admission medications   Medication Sig Start Date End Date Taking? Authorizing Provider  allopurinol (ZYLOPRIM) 300 MG tablet Take 300 mg by mouth daily.   Yes Historical Provider, MD  digoxin (LANOXIN) 0.125 MG tablet Take 0.125 mg by  mouth daily.   Yes Historical Provider, MD  fenofibrate (TRICOR) 145 MG tablet Take 145 mg by mouth daily.   Yes Historical Provider, MD  furosemide (LASIX) 40 MG tablet Take 40 mg by mouth daily.   Yes Historical Provider, MD  gabapentin (NEURONTIN) 100 MG capsule Take 100 mg by mouth 3 (three) times daily.   Yes Historical Provider, MD  insulin aspart protamine- aspart (NOVOLOG MIX 70/30) (70-30) 100 UNIT/ML injection Inject 60 Units into the skin 2 (two) times daily with a meal.   Yes Historical Provider, MD  levothyroxine  (SYNTHROID, LEVOTHROID) 25 MCG tablet Take 25 mcg by mouth daily before breakfast.   Yes Historical Provider, MD  lisinopril (PRINIVIL,ZESTRIL) 40 MG tablet Take 40 mg by mouth at bedtime.   Yes Historical Provider, MD  metoprolol (LOPRESSOR) 50 MG tablet Take 50 mg by mouth 2 (two) times daily.   Yes Historical Provider, MD  pravastatin (PRAVACHOL) 40 MG tablet Take 40 mg by mouth daily.   Yes Historical Provider, MD  warfarin (COUMADIN) 5 MG tablet Take 5 mg by mouth daily.   Yes Historical Provider, MD  oxycodone (OXY-IR) 5 MG capsule Take 5 mg by mouth every 6 (six) hours as needed.    Historical Provider, MD       Physical Exam: BP (!) 92/59 (BP Location: Left Arm)   Pulse (!) 142 Afib  Temp 98.5 F (36.9 C) (Axillary)   Resp 16   Wt 109.2 kg (240 lb 11.2 oz)   SpO2 90%  General appearance: Elderly obese adult female, awake but listless and tired.   Eyes: Anicteric, conjunctiva pink, lids and lashes normal. PERRL.    ENT: No nasal deformity, discharge, epistaxis.  Hearing normal. OP moist without lesions.   Neck: No neck masses.  Trachea midline.  No thyromegaly/tenderness. Lymph: No cervical or supraclavicular lymphadenopathy. Skin: Warm and dry.  No jaundice.  No suspicious rashes or lesions.  Ankle fracture has no drainage over incision, healing with what appears to be no swelling, normal redness. Cardiac: Irregularly irregular, normal rate on my exam, nl S1-S2, soft SEM appreciated.  Capillary refill is brisk.  JVP not visible.  No LE edema.  Radial pulses 2+ and symmetric.  DP pulses diminished. Respiratory: Normal respiratory rate and rhythm.  CTAB without rales or wheezes. Abdomen: Abdomen soft.  Nonfocal mild TTP, no guarding. No ascites, distension, hepatosplenomegaly.   MSK: No deformities or effusions.  No cyanosis or clubbing. Neuro: Does not follow commands for EOMI.  PERRL and ocular movements seem intact.  Face symmetric.  Palate equal.  Sensation intact to light  touch. Speech is fluent.  Muscle strength globally and symmetrically weak.   Oriented to [place], December.  Tangential and drifts off with any answers longer than 1-2 words. Psych: Sensorium intact and responding to questions, attention dimished, sleepy.  Affect blunted.  Judgment and insight appear diminished.     Labs on Admission:  I have personally reviewed following labs and imaging studies from the OSH: Panel shows sodium 135, potassium normal, creatinine 4.2, blood glucose 108 Complete blood count shows leukocytosis 18.8, no anemia or thrombocytopenia TSH normal at outside hospital        EKG: Independently reviewed. Rate 81, QTc 390, atrial fibrillation, poor baseline, nonspecific T wave changes, no previous for comparison, no ST changes.      Assessment/Plan  1. Acute kidney failure:  -Obtain renal US -Fluids and trend BMP -Consult to Nephrology, appreciate cares -Strict I/Os -Avoid  nephrotoxins    2. C diff colitis:  -Continue vancomycin, increased to 500 QID given hypotension -Precautions -Soft stools today, may advance diet as tolerated  3. Metabolic encephalopathy:  Noted initially on admission to OSH.  Still somewhat weak and confused here. -Hold gabapentin -Minimize oxycodone  4. Right ankle fracture:  Appears clean dry and intact -Continue oxycodone PRN  5. CHF, unspecified type:  EF unknown.  Chart history.  Echo not available from discharging facility. -Hold furosemide -Obtain echocardiogram  6. IDDM:  -Hold home 70/30 -Glargine 30 units daily -SSI renal dose with meals  7. HTN:  -Hold metoprolol given hypotension -Hold ACEi given AKI -Continue statin  8. Chronic atrial fibrillation: CHADS2Vasc 6.  On warfarin. -Continue warfarin -Hold metoprolol given hypotension -Continue digoxin  9. Other medications: -Hold allopurinol for now -Hold fenofibrate for now        DVT prophylaxis: N/A on warfarin  Code Status:  DO NOT RESUSCITATE  Family Communication: None present  Disposition Plan: Anticipate Nephrology consultation and monitor renal function Consults called: None overnight Admission status: INPATIENT, stepdown    Medical decision making: Patient seen at 11:10 PM on 10/28/2016.  What exists of the patient's chart was reviewed in depth from [place] and summarized above.  Clinical condition: requiring additional fluids and close hemodynamic monitoring overnight.        Edwin Dada [facility] Pager [phone]

## 2016-10-28 NOTE — Progress Notes (Signed)
Pt. Arrived to unit from [facility],  She is alert with no pain complaint. Vitals signs are stable, [facility] has been paged. Will continue to monitor.

## 2016-10-29 ENCOUNTER — Inpatient Hospital Stay (HOSPITAL_COMMUNITY): Payer: Medicare Other

## 2016-10-29 DIAGNOSIS — L8990 Pressure ulcer of unspecified site, unspecified stage: Secondary | ICD-10-CM | POA: Diagnosis present

## 2016-10-29 LAB — URINALYSIS, ROUTINE W REFLEX MICROSCOPIC
BILIRUBIN URINE: NEGATIVE
Glucose, UA: NEGATIVE mg/dL
KETONES UR: NEGATIVE mg/dL
Nitrite: NEGATIVE
PROTEIN: 30 mg/dL — AB
Specific Gravity, Urine: 1.016 (ref 1.005–1.030)
pH: 5 (ref 5.0–8.0)

## 2016-10-29 LAB — COMPREHENSIVE METABOLIC PANEL
ALT: 13 U/L — ABNORMAL LOW (ref 14–54)
AST: 23 U/L (ref 15–41)
Albumin: 1.6 g/dL — ABNORMAL LOW (ref 3.5–5.0)
Alkaline Phosphatase: 76 U/L (ref 38–126)
Anion gap: 11 (ref 5–15)
BUN: 100 mg/dL — AB (ref 6–20)
CHLORIDE: 111 mmol/L (ref 101–111)
CO2: 16 mmol/L — ABNORMAL LOW (ref 22–32)
Calcium: 7.6 mg/dL — ABNORMAL LOW (ref 8.9–10.3)
Creatinine, Ser: 4.33 mg/dL — ABNORMAL HIGH (ref 0.44–1.00)
GFR calc Af Amer: 11 mL/min — ABNORMAL LOW (ref >60)
GFR, EST NON AFRICAN AMERICAN: 9 mL/min — AB (ref >60)
Glucose, Bld: 116 mg/dL — ABNORMAL HIGH (ref 65–99)
POTASSIUM: 4.1 mmol/L (ref 3.5–5.1)
SODIUM: 138 mmol/L (ref 135–145)
Total Bilirubin: 0.6 mg/dL (ref 0.3–1.2)
Total Protein: 4.1 g/dL — ABNORMAL LOW (ref 6.5–8.1)

## 2016-10-29 LAB — GLUCOSE, CAPILLARY
GLUCOSE-CAPILLARY: 104 mg/dL — AB (ref 65–99)
GLUCOSE-CAPILLARY: 127 mg/dL — AB (ref 65–99)
GLUCOSE-CAPILLARY: 88 mg/dL (ref 65–99)
Glucose-Capillary: 99 mg/dL (ref 65–99)
Glucose-Capillary: 82 mg/dL (ref 65–99)

## 2016-10-29 LAB — CBC
HEMATOCRIT: 42.1 % (ref 36.0–46.0)
Hemoglobin: 13.7 g/dL (ref 12.0–15.0)
MCH: 29.8 pg (ref 26.0–34.0)
MCHC: 32.5 g/dL (ref 30.0–36.0)
MCV: 91.5 fL (ref 78.0–100.0)
Platelets: 308 10*3/uL (ref 150–400)
RBC: 4.6 MIL/uL (ref 3.87–5.11)
RDW: 16.6 % — AB (ref 11.5–15.5)
WBC: 21.2 10*3/uL — AB (ref 4.0–10.5)

## 2016-10-29 LAB — LACTIC ACID, PLASMA
Lactic Acid, Venous: 1.1 mmol/L (ref 0.5–1.9)
Lactic Acid, Venous: 1.1 mmol/L (ref 0.5–1.9)

## 2016-10-29 LAB — MRSA PCR SCREENING: MRSA by PCR: NEGATIVE

## 2016-10-29 LAB — CK: Total CK: 59 U/L (ref 38–234)

## 2016-10-29 LAB — TSH: TSH: 5.716 u[IU]/mL — ABNORMAL HIGH (ref 0.350–4.500)

## 2016-10-29 LAB — CORTISOL: CORTISOL PLASMA: 18 ug/dL

## 2016-10-29 LAB — DIGOXIN LEVEL: Digoxin Level: 1.1 ng/mL (ref 0.8–2.0)

## 2016-10-29 LAB — PROTIME-INR
INR: 3.55
Prothrombin Time: 36.4 seconds — ABNORMAL HIGH (ref 11.4–15.2)

## 2016-10-29 MED ORDER — SODIUM CHLORIDE 0.9 % IV BOLUS (SEPSIS)
1000.0000 mL | Freq: Once | INTRAVENOUS | Status: AC
  Administered 2016-10-29: 1000 mL via INTRAVENOUS

## 2016-10-29 MED ORDER — WARFARIN - PHARMACIST DOSING INPATIENT: Freq: Every day | Status: DC

## 2016-10-29 MED ORDER — STERILE WATER FOR INJECTION IV SOLN
150.0000 meq | INTRAVENOUS | Status: DC
  Administered 2016-10-29 (×2): 150 meq via INTRAVENOUS
  Filled 2016-10-29 (×7): qty 850

## 2016-10-29 NOTE — Progress Notes (Signed)
Spoke with Dr. Loleta Books about pt's hypotension and fluids.  Orders received for bolus in process, will continue to monitor.

## 2016-10-29 NOTE — Progress Notes (Signed)
On arrival, patient still moderately hypotensive, MAP>65.  Appeared to be oriented, not tachycardic nor febrile.  Per OSH, patient had been improving on oral vanc and ampicillin, WBC trending down, hemodynamically stable and not hypotensive.  This morning, still somewhat hypotensive.  Will give additional fluids and check lactic acid.  If elevated, will given 30 cc/kg bolus and broaden antibiotics and d/w PCCM.

## 2016-10-29 NOTE — Progress Notes (Addendum)
Patient ID: [name], female   DOB: December 14, 1940, 75 y.o.   MRN: [id number]    PROGRESS NOTE    Danielle Woodard  [id number] DOB: 07-28-1941 DOA: 10/28/2016  PCP: No primary care provider on file.   Brief Narrative:  75 y.o. female with a past medical history significant for Afib on warfarin, IDDM, HTN, CKD III baseline Cr 1.3, hypothyroidism, and CHF unknown EF who was transferred to [facility] for AKI. The patient underwent surgical repair of a right ankle fracture 3 weeks ago, uneventful post-op course, discharged to SNF for rehab.  Now in last 1-2 weeks, has had progressive watery diarrhea until three days PTA and was brought to [facility] ER for vomiting and new confusion. There, she was found to have WBC 26K, Cr 4.1 (baseline 1.3), Na 130, INR 9.5, C diff toxin positive, T 97.9, HR 86, BP 103/52, lactic acid 1.3.  Was given Flagyl for Cdiff and fluids for AKI and admitted to [facility] for further management.    Outside hospital course: - Cr trend 4.1 --> 4.1 --> 4.6 --> 4.2, oliguric - WBC trend 26K --> 24.5K --> 25K --> 18.8K  - Urine culture grew enterococcus and so she was started on ampicillin there - She was transitioned to oral vancomycin 125 mg QID on HD 2 there - FeNA was 0.5 and UA was bland - due to persistent renal failure, the case was discussed with Nephrology at [facility] who agreed to evaluate the patient on transfer  On arrival of [facility] to pick up the patient 12/09, the patient was hypotensive to 70/40 by Dynamap and 80/50 manually.  [facility] team discussed with on-call hospitalist here who recommended PCCM consult before transfer.  PCCM recommended discharging hospitalist re-evaluation, although by this time the patient was already en route.   Assessment & Plan:   Acute kidney failure, metabolic acidosis  - in setting of volume depletion due to C Diff colitis, hypotension, ACE inhibition, poor po intake  - consistent with pre-renal and ischemic ATN - no indication  for HD at this time - SBP stable this AM, 130's, HR 91 - appreciate nephrology team following - change IVF to isotonic bicarb per nephrology team  - BMP in AM  C diff colitis - Continue vancomycin, increased to 500 QID given hypotension - advance diet as tolerated  Metabolic encephalopathy - secondary to the above, AKI, colitis, dehydration  - Hold gabapentin - Minimize oxycodone  Right ankle fracture - Appears clean dry and intact - PT eval once pt more medically stable   CHF, unspecified type:  - EF unknown.  Chart history - ECHO pending   IDDM with long term use insulin  - Hold home 70/30 - Glargine 30 units daily - added SSI   HTN, essential   - Hold metoprolol given hypotension - Hold ACEi given AKI - Continue statin  Chronic atrial fibrillation, bradycardia  - CHADS2Vasc 6 - coumadin per pharmacy  - Hold metoprolol given hypotension and bradycardia  - hold digoxin   DVT prophylaxis: SCD's, Coumadin per pharmacy  Code Status: DNR Family Communication: Patient at bedside  Disposition Plan: To be determined   Consultants:   Nephrology   Procedures:   None  Antimicrobials:   Ampicillin  Oral vancomycin   Subjective: No events overnight.   Objective: Vitals:   10/29/16 0318 10/29/16 0630 10/29/16 0744 10/29/16 1104  BP: (!) 87/45 (!) 82/59 (!) 98/39 (!) 130/97  Pulse: (!) 58 60 (!) 55 91  Resp: 14 14 11  12  Temp: 97.5 F (36.4 C)  97.1 F (36.2 C) 97.5 F (36.4 C)  TempSrc: Oral  Axillary Oral  SpO2: 98% 100% 97% 100%  Weight:        Intake/Output Summary (Last 24 hours) at 10/29/16 1336 Last data filed at 10/29/16 1200  Gross per 24 hour  Intake           431.25 ml  Output              175 ml  Net           256.25 ml   Filed Weights   10/28/16 2304  Weight: 109.2 kg (240 lb 11.2 oz)    Examination:  General exam: Appears calm and comfortable  Respiratory system: Respiratory effort normal. Cardiovascular system: IRRR.  No JVD, rubs, gallops or clicks.  Gastrointestinal system: Abdomen is nondistended, soft and nontender. No organomegaly or masses felt. Normal bowel sounds heard. Central nervous system: Alert and oriented.  Data Reviewed: I have personally reviewed following labs and imaging studies  CBC:  Recent Labs Lab 10/29/16 0406  WBC 21.2*  HGB 13.7  HCT 42.1  MCV 91.5  PLT [id number]   Basic Metabolic Panel:  Recent Labs Lab 10/29/16 0406  NA 138  K 4.1  CL 111  CO2 16*  GLUCOSE 116*  BUN 100*  CREATININE 4.33*  CALCIUM 7.6*   Liver Function Tests:  Recent Labs Lab 10/29/16 0406  AST 23  ALT 13*  ALKPHOS 76  BILITOT 0.6  PROT 4.1*  ALBUMIN 1.6*   Coagulation Profile:  Recent Labs Lab 10/29/16 0713  INR 3.55   CBG:  Recent Labs Lab 10/29/16 0056 10/29/16 0812 10/29/16 1147  GLUCAP 127* 88 99   Urine analysis:    Component Value Date/Time   COLORURINE AMBER (A) 10/29/2016 0057   APPEARANCEUR CLOUDY (A) 10/29/2016 0057   LABSPEC 1.016 10/29/2016 0057   PHURINE 5.0 10/29/2016 0057   GLUCOSEU NEGATIVE 10/29/2016 0057   HGBUR LARGE (A) 10/29/2016 0057   BILIRUBINUR NEGATIVE 10/29/2016 0057   KETONESUR NEGATIVE 10/29/2016 0057   PROTEINUR 30 (A) 10/29/2016 0057   NITRITE NEGATIVE 10/29/2016 0057   LEUKOCYTESUR MODERATE (A) 10/29/2016 0057   Radiology Studies: US Renal  Result Date: 10/29/2016 CLINICAL DATA:  Acute renal failure EXAM: RENAL / URINARY TRACT ULTRASOUND COMPLETE COMPARISON:  None available FINDINGS: Right Kidney: Length: 15.1 cm. Very difficult to visualize sonographically. No gross hydronephrosis. Hypoechoic right renal cyst noted, largest measures 7.1 x 7.4 x 8.2 cm. Second right renal cyst measures 4.0 x 5.2 x 3.8 cm. Left Kidney: Length: 11.8 cm. Scattered hypoechoic exophytic left renal cysts (3 visualized). Largest in the upper pole measures 3.8 cm. Mid and lower pole cysts measure 1.7 and 1.6 cm respectively. No hydronephrosis. Normal  echogenicity. Bladder: Collapsed.  Not visualized. Additional findings: Left pleural effusion demonstrated. Hepatic calcification noted along the liver hilum measuring 1.7 cm suspicious for hepatic granuloma. No upper abdominal free fluid. IMPRESSION: Limited visualization of the right kidney secondary to body habitus. No gross hydronephrosis or acute process Bilateral renal cysts Completely collapsed bladder Left pleural effusion Electronically Signed   By: Jerilynn Mages.  Shick M.D.   On: 10/29/2016 09:17    Scheduled Meds: . ampicillin (OMNIPEN) IV  1 g Intravenous Q12H  . digoxin  0.125 mg Oral Daily  . insulin aspart  0-5 Units Subcutaneous QHS  . insulin aspart  0-9 Units Subcutaneous TID WC  . insulin glargine  30 Units Subcutaneous  Daily  . levothyroxine  25 mcg Oral QAC breakfast  . pravastatin  40 mg Oral Daily  . sodium chloride flush  3 mL Intravenous Q12H  . vancomycin  500 mg Oral Q6H   Continuous Infusions: .  sodium bicarbonate 150 mEq in sterile water 1000 mL infusion 150 mEq (10/29/16 1200)     LOS: 1 day   Time spent: 20 minutes   Danielle Ramsay, MD [facility] Pager [phone]  If 7PM-7AM, please contact night-coverage [email or web address] Password [email or web address] 10/29/2016, 1:36 PM

## 2016-10-29 NOTE — Progress Notes (Signed)
ANTICOAGULATION CONSULT NOTE - Initial Consult  Pharmacy Consult for Coumadin Indication: atrial fibrillation  Not on File  Patient Measurements: Weight: 240 lb 11.2 oz (109.2 kg)  Vital Signs: Temp: 97.5 F (36.4 C) (12/09 0318) Temp Source: Oral (12/09 0318) BP: 87/45 (12/09 0318) Pulse Rate: 58 (12/09 0318)  Labs:  Recent Labs  10/29/16 0406  HGB 13.7  HCT 42.1  PLT PENDING  CREATININE 4.33*    CrCl cannot be calculated (Unknown ideal weight.).   Medical History: Past Medical History:  Diagnosis Date  . CHF (congestive heart failure) ([place])   . Chronic kidney disease   . Diabetes mellitus without complication ([place])   . Dysrhythmia   . Hypertension   . Hypothyroidism     Home Medications: Albuterol  Allopurinol  Digoxin  Lasix  Neurontin  Zestril  Pravachol Oscal-D  Novolog 70/30  Lopressor  Tricor  Cipro  Synthroid  Ultram    Coumadin 1 mg daily except 2 mg Wed/Sat per 08/2016 discharge summary   Assessment: 75 y.o. female transferred from [facility] with ARF and CDiff colitis, h/o Afib, to continue Coumadin.   Admitted to [facility] 12/5 with supratheraputic INR of 9.5.  Received Vit K 5 mg orally, and INR decreased to 2.2 by 12/7.  Coumadin 2 mg daily restarted and was given 12/7 and 12/8.    Goal of Therapy:  INR 2-3 Monitor platelets by anticoagulation protocol: Yes   Plan:  F/U daily INR  Brighton Pilley, Bronson Curb 10/29/2016,5:37 AM

## 2016-10-29 NOTE — Consult Note (Signed)
Reason for Consult: AKI/CKD Referring Physician: Dr. Jethro Bolus is an 75 y.o. female.  HPI: Danielle Woodard has a past medical history significant for DM, HTN, CHF, A fib on coumadin, and CKD stage 3 (baseline Scr 1.3-1.5) who presented to [facility] ([place] with a 2 week course of N/V/Diarrhea and found to have WBC 26K, Cr 4.1, Na 130, INR 9.5, and +C Diff toxin with hypotension.  She was started on flagyl and IVF's (had a low FeNa at admission and had been on an ACE inhibitor and diuretic prior to admission).  Despite IVF's her Scr remained elevated and had some decreased UOP and was transferred to [facility] for ongoing management of her C Diff colitis, hypotension, and AKI/CKD.    Danielle Woodard is very somnolent but arousable and not able to answer questions clearly.  History is obtained through review of the medical records.     Trend in Creatinine: Creatinine, Ser  Date/Time Value Ref Range Status  10/29/2016 04:06 AM 4.33 (H) 0.44 - 1.00 mg/dL Final    PMH:   Past Medical History:  Diagnosis Date  . CHF (congestive heart failure) ([place])   . Chronic kidney disease   . Diabetes mellitus without complication ([place])   . Dysrhythmia   . Hypertension   . Hypothyroidism     PSH:  History reviewed. No pertinent surgical history.  Allergies: Not on File  Medications:   Prior to Admission medications   Medication Sig Start Date End Date Taking? Authorizing Provider  allopurinol (ZYLOPRIM) 300 MG tablet Take 300 mg by mouth daily.   Yes Historical Provider, MD  digoxin (LANOXIN) 0.125 MG tablet Take 0.125 mg by mouth daily.   Yes Historical Provider, MD  fenofibrate (TRICOR) 145 MG tablet Take 145 mg by mouth daily.   Yes Historical Provider, MD  furosemide (LASIX) 40 MG tablet Take 40 mg by mouth daily.   Yes Historical Provider, MD  gabapentin (NEURONTIN) 100 MG capsule Take 100 mg by mouth 3 (three) times daily.   Yes Historical Provider, MD  insulin aspart protamine- aspart  (NOVOLOG MIX 70/30) (70-30) 100 UNIT/ML injection Inject 60 Units into the skin 2 (two) times daily with a meal.   Yes Historical Provider, MD  levothyroxine (SYNTHROID, LEVOTHROID) 25 MCG tablet Take 25 mcg by mouth daily before breakfast.   Yes Historical Provider, MD  lisinopril (PRINIVIL,ZESTRIL) 40 MG tablet Take 40 mg by mouth at bedtime.   Yes Historical Provider, MD  metoprolol (LOPRESSOR) 50 MG tablet Take 50 mg by mouth 2 (two) times daily.   Yes Historical Provider, MD  pravastatin (PRAVACHOL) 40 MG tablet Take 40 mg by mouth daily.   Yes Historical Provider, MD  warfarin (COUMADIN) 5 MG tablet Take 5 mg by mouth daily.   Yes Historical Provider, MD  oxycodone (OXY-IR) 5 MG capsule Take 5 mg by mouth every 6 (six) hours as needed.    Historical Provider, MD    Inpatient medications: . ampicillin (OMNIPEN) IV  1 g Intravenous Q12H  . digoxin  0.125 mg Oral Daily  . insulin aspart  0-5 Units Subcutaneous QHS  . insulin aspart  0-9 Units Subcutaneous TID WC  . insulin glargine  30 Units Subcutaneous Daily  . levothyroxine  25 mcg Oral QAC breakfast  . pravastatin  40 mg Oral Daily  . sodium chloride flush  3 mL Intravenous Q12H  . vancomycin  500 mg Oral Q6H  . Warfarin - Pharmacist Dosing Inpatient   Does not  apply q1800    Discontinued Meds:   Medications Discontinued During This Encounter  Medication Reason  . ampicillin IVPB   . vancomycin (VANCOCIN) 125 MG capsule     Social History:  reports that she has quit smoking. She has never used smokeless tobacco. Her alcohol and drug histories are not on file.  Family History:  History reviewed. No pertinent family history.  Review of systems not obtained due to patient factors. Weight change:   Intake/Output Summary (Last 24 hours) at 10/29/16 0828 Last data filed at 10/29/16 0744  Gross per 24 hour  Intake           283.33 ml  Output               50 ml  Net           233.33 ml   BP (!) 98/39 (BP Location: Right  Arm)   Pulse (!) 55   Temp 97.1 F (36.2 C) (Axillary)   Resp 11   Wt 109.2 kg (240 lb 11.2 oz)   SpO2 97%  Vitals:   10/29/16 0217 10/29/16 0318 10/29/16 0630 10/29/16 0744  BP: 110/73 (!) 87/45 (!) 82/59 (!) 98/39  Pulse:  (!) 58 60 (!) 55  Resp:  14 14 11   Temp: 97.5 F (36.4 C) 97.5 F (36.4 C)  97.1 F (36.2 C)  TempSrc: Oral Oral  Axillary  SpO2: 96% 98% 100% 97%  Weight:         General appearance: delirious, fatigued, pale and slowed mentation Head: Normocephalic, without obvious abnormality, atraumatic Resp: clear to auscultation bilaterally Cardio: bradycardic, no rub GI: soft, non-tender; bowel sounds normal; no masses,  no organomegaly Extremities: edema 1+ edema of lower extremities  Labs: Basic Metabolic Panel:  Recent Labs Lab 10/29/16 0406  NA 138  K 4.1  CL 111  CO2 16*  GLUCOSE 116*  BUN 100*  CREATININE 4.33*  ALBUMIN 1.6*  CALCIUM 7.6*   Liver Function Tests:  Recent Labs Lab 10/29/16 0406  AST 23  ALT 13*  ALKPHOS 76  BILITOT 0.6  PROT 4.1*  ALBUMIN 1.6*   No results for input(s): LIPASE, AMYLASE in the last 168 hours. No results for input(s): AMMONIA in the last 168 hours. CBC:  Recent Labs Lab 10/29/16 0406  WBC 21.2*  HGB 13.7  HCT 42.1  MCV 91.5  PLT 308   PT/INR: @LABRCNTIP (inr:5) Cardiac Enzymes: )No results for input(s): CKTOTAL, CKMB, CKMBINDEX, TROPONINI in the last 168 hours. CBG:  Recent Labs Lab 10/29/16 0056 10/29/16 0812  GLUCAP 127* 88    Iron Studies: No results for input(s): IRON, TIBC, TRANSFERRIN, FERRITIN in the last 168 hours.  Xrays/Other Studies: US Renal  Result Date: 10/29/2016 CLINICAL DATA:  Acute renal failure EXAM: RENAL / URINARY TRACT ULTRASOUND COMPLETE COMPARISON:  None available FINDINGS: Right Kidney: Length: 15.1 cm. Very difficult to visualize sonographically. No gross hydronephrosis. Hypoechoic right renal cyst noted, largest measures 7.1 x 7.4 x 8.2 cm. Second right renal  cyst measures 4.0 x 5.2 x 3.8 cm. Left Kidney: Length: 11.8 cm. Scattered hypoechoic exophytic left renal cysts (3 visualized). Largest in the upper pole measures 3.8 cm. Mid and lower pole cysts measure 1.7 and 1.6 cm respectively. No hydronephrosis. Normal echogenicity. Bladder: Collapsed.  Not visualized. Additional findings: Left pleural effusion demonstrated. Hepatic calcification noted along the liver hilum measuring 1.7 cm suspicious for hepatic granuloma. No upper abdominal free fluid. IMPRESSION: Limited visualization of the right kidney secondary  to body habitus. No gross hydronephrosis or acute process Bilateral renal cysts Completely collapsed bladder Left pleural effusion Electronically Signed   By: Jerilynn Mages.  Shick M.D.   On: 10/29/2016 09:17     Assessment/Plan: 1.  AKI/CKD- in setting of volume depletion due to C Diff colitis, hypotension, ACE inhibition, Low FeNa, and poor po intake consistent with pre-renal and ischemic ATN.  Will change IVF's to isotonic bicarb due to worsening metabolic acidosis 1. No urgent inidication for HD but she remains hypotensive despite volume resuscitation and may require pressors, especially if her bradycardia worsens 2. Continue to follow closely 2. Metabolic acidosis- due to #1 as above change to isotonic bicarb 3. Bradycardia- h/o A fib.  Hold digoxin and check level in light of AKI.  Hopefully she is not Dig toxic.  Will check level and recommend cardiology evaluation 4. C diff Colitis- on po vanco 5. A fib- as above hold digoxin for now and check levels before dosing again.  Consult cardiology in case she needs digibind. 6. Protein malnutrition- per primary 7. DM- per primary 8. AMS- possibly due to hypotension, metabolic encephalopathy, or uremia or combination.  Continue with further workup. 9. Disposition- pt is critically ill and is DNR, may need palliative care consult to help set  Goals/limits of care as we may need to escalate if she does not  improve.   Danielle Woodard 10/29/2016, 8:28 AM

## 2016-10-30 ENCOUNTER — Inpatient Hospital Stay (HOSPITAL_COMMUNITY): Payer: Medicare Other

## 2016-10-30 ENCOUNTER — Other Ambulatory Visit (HOSPITAL_COMMUNITY): Payer: Medicare Other

## 2016-10-30 DIAGNOSIS — I509 Heart failure, unspecified: Secondary | ICD-10-CM

## 2016-10-30 LAB — URINE CULTURE: CULTURE: NO GROWTH

## 2016-10-30 LAB — ECHOCARDIOGRAM COMPLETE
AO mean calculated velocity dopler: 172 cm/s
AV Area mean vel: 0.82 cm2
AV VEL mean LVOT/AV: 0.46
AVAREAVTI: 0.78 cm2
AVG: 14 mmHg
AVPG: 26 mmHg
AVPKVEL: 253 cm/s
Ao pk vel: 0.44 m/s
CHL CUP AV VEL: 0.87
FS: 52 % — AB (ref 28–44)
IVS/LV PW RATIO, ED: 0.99
LA ID, A-P, ES: 47 mm
LA diam end sys: 47 mm
LA vol A4C: 94.5 ml
LAVOL: 95.7 mL
LDCA: 1.77 cm2
LV PW d: 11.6 mm — AB (ref 0.6–1.1)
LVOT SV: 42 mL
LVOT VTI: 23.9 cm
LVOT peak grad rest: 5 mmHg
LVOT peak vel: 112 cm/s
LVOTD: 15 mm
LVOTVTI: 0.49 cm
RV TAPSE: 15.6 mm
VTI: 48.5 cm
Valve area: 0.87 cm2
Weight: 3851.2 oz

## 2016-10-30 LAB — CBC
HCT: 39.9 % (ref 36.0–46.0)
Hemoglobin: 13.1 g/dL (ref 12.0–15.0)
MCH: 29.2 pg (ref 26.0–34.0)
MCHC: 32.8 g/dL (ref 30.0–36.0)
MCV: 88.9 fL (ref 78.0–100.0)
PLATELETS: 251 10*3/uL (ref 150–400)
RBC: 4.49 MIL/uL (ref 3.87–5.11)
RDW: 16.2 % — ABNORMAL HIGH (ref 11.5–15.5)
WBC: 10.7 10*3/uL — AB (ref 4.0–10.5)

## 2016-10-30 LAB — GLUCOSE, CAPILLARY
GLUCOSE-CAPILLARY: 95 mg/dL (ref 65–99)
GLUCOSE-CAPILLARY: 132 mg/dL — AB (ref 65–99)
Glucose-Capillary: 100 mg/dL — ABNORMAL HIGH (ref 65–99)
Glucose-Capillary: 113 mg/dL — ABNORMAL HIGH (ref 65–99)

## 2016-10-30 LAB — RENAL FUNCTION PANEL
Albumin: 1.5 g/dL — ABNORMAL LOW (ref 3.5–5.0)
Anion gap: 11 (ref 5–15)
BUN: 98 mg/dL — ABNORMAL HIGH (ref 6–20)
CALCIUM: 7.9 mg/dL — AB (ref 8.9–10.3)
CO2: 22 mmol/L (ref 22–32)
Chloride: 106 mmol/L (ref 101–111)
Creatinine, Ser: 3.83 mg/dL — ABNORMAL HIGH (ref 0.44–1.00)
GFR, EST AFRICAN AMERICAN: 12 mL/min — AB (ref >60)
GFR, EST NON AFRICAN AMERICAN: 11 mL/min — AB (ref >60)
Glucose, Bld: 79 mg/dL (ref 65–99)
PHOSPHORUS: 6.8 mg/dL — AB (ref 2.5–4.6)
Potassium: 3.6 mmol/L (ref 3.5–5.1)
SODIUM: 139 mmol/L (ref 135–145)

## 2016-10-30 LAB — HEMOGLOBIN A1C
HEMOGLOBIN A1C: 7.1 % — AB (ref 4.8–5.6)
Mean Plasma Glucose: 157 mg/dL

## 2016-10-30 LAB — PROTIME-INR
INR: 4.79 — AB
Prothrombin Time: 45.7 seconds — ABNORMAL HIGH (ref 11.4–15.2)

## 2016-10-30 MED ORDER — STERILE WATER FOR INJECTION IV SOLN
150.0000 meq | INTRAVENOUS | Status: DC
  Administered 2016-10-30 – 2016-10-31 (×2): 150 meq via INTRAVENOUS
  Filled 2016-10-30 (×4): qty 850

## 2016-10-30 NOTE — Progress Notes (Signed)
Patient ID: Danielle Woodard, female   DOB: 03-Jul-1941, 75 y.o.   MRN: [id number] S:More awake and alert today.  Feels better O:BP (!) 134/53 (BP Location: Right Arm)   Pulse 69   Temp 97.7 F (36.5 C) (Oral)   Resp 16   Wt 109.2 kg (240 lb 11.2 oz)   SpO2 91%   Intake/Output Summary (Last 24 hours) at 10/30/16 1231 Last data filed at 10/30/16 1200  Gross per 24 hour  Intake             3580 ml  Output              725 ml  Net             2855 ml   Intake/Output: I/O last 3 completed shifts: In: 2971.3 [P.O.:240; I.V.:2681.3; IV Piggyback:50] Out: 525 [Urine:525]  Intake/Output this shift:  Total I/O In: 1040 [P.O.:240; I.V.:750; IV Piggyback:50] Out: 375 [Urine:375] Weight change:  Gen:WD elderly WF in NAD CVS:no rub Resp:cta [id number] Ext:1+ edema   Recent Labs Lab 10/29/16 0406 10/30/16 0438  NA 138 139  K 4.1 3.6  CL 111 106  CO2 16* 22  GLUCOSE 116* 79  BUN 100* 98*  CREATININE 4.33* 3.83*  ALBUMIN 1.6* 1.5*  CALCIUM 7.6* 7.9*  PHOS  --  6.8*  AST 23  --   ALT 13*  --    Liver Function Tests:  Recent Labs Lab 10/29/16 0406 10/30/16 0438  AST 23  --   ALT 13*  --   ALKPHOS 76  --   BILITOT 0.6  --   PROT 4.1*  --   ALBUMIN 1.6* 1.5*   No results for input(s): LIPASE, AMYLASE in the last 168 hours. No results for input(s): AMMONIA in the last 168 hours. CBC:  Recent Labs Lab 10/29/16 0406 10/30/16 0438  WBC 21.2* 10.7*  HGB 13.7 13.1  HCT 42.1 39.9  MCV 91.5 88.9  PLT 308 251   Cardiac Enzymes:  Recent Labs Lab 10/29/16 1224  CKTOTAL 59   CBG:  Recent Labs Lab 10/29/16 1147 10/29/16 1557 10/29/16 2133 10/30/16 0741 10/30/16 1136  GLUCAP 99 82 104* 95 132*    Iron Studies: No results for input(s): IRON, TIBC, TRANSFERRIN, FERRITIN in the last 72 hours. Studies/Results: US Renal  Result Date: 10/29/2016 CLINICAL DATA:  Acute renal failure EXAM: RENAL / URINARY TRACT ULTRASOUND COMPLETE COMPARISON:  None available  FINDINGS: Right Kidney: Length: 15.1 cm. Very difficult to visualize sonographically. No gross hydronephrosis. Hypoechoic right renal cyst noted, largest measures 7.1 x 7.4 x 8.2 cm. Second right renal cyst measures 4.0 x 5.2 x 3.8 cm. Left Kidney: Length: 11.8 cm. Scattered hypoechoic exophytic left renal cysts (3 visualized). Largest in the upper pole measures 3.8 cm. Mid and lower pole cysts measure 1.7 and 1.6 cm respectively. No hydronephrosis. Normal echogenicity. Bladder: Collapsed.  Not visualized. Additional findings: Left pleural effusion demonstrated. Hepatic calcification noted along the liver hilum measuring 1.7 cm suspicious for hepatic granuloma. No upper abdominal free fluid. IMPRESSION: Limited visualization of the right kidney secondary to body habitus. No gross hydronephrosis or acute process Bilateral renal cysts Completely collapsed bladder Left pleural effusion Electronically Signed   By: Jerilynn Mages.  Shick M.D.   On: 10/29/2016 09:17   . ampicillin (OMNIPEN) IV  1 g Intravenous Q12H  . digoxin  0.125 mg Oral Daily  . insulin aspart  0-5 Units Subcutaneous QHS  . insulin aspart  0-9 Units Subcutaneous TID WC  .  insulin glargine  30 Units Subcutaneous Daily  . levothyroxine  25 mcg Oral QAC breakfast  . pravastatin  40 mg Oral Daily  . sodium chloride flush  3 mL Intravenous Q12H  . vancomycin  500 mg Oral Q6H    BMET    Component Value Date/Time   NA 139 10/30/2016 0438   K 3.6 10/30/2016 0438   CL 106 10/30/2016 0438   CO2 22 10/30/2016 0438   GLUCOSE 79 10/30/2016 0438   BUN 98 (H) 10/30/2016 0438   CREATININE 3.83 (H) 10/30/2016 0438   CALCIUM 7.9 (L) 10/30/2016 0438   GFRNONAA 11 (L) 10/30/2016 0438   GFRAA 12 (L) 10/30/2016 0438   CBC    Component Value Date/Time   WBC 10.7 (H) 10/30/2016 0438   RBC 4.49 10/30/2016 0438   HGB 13.1 10/30/2016 0438   HCT 39.9 10/30/2016 0438   PLT 251 10/30/2016 0438   MCV 88.9 10/30/2016 0438   MCH 29.2 10/30/2016 0438   MCHC  32.8 10/30/2016 0438   RDW 16.2 (H) 10/30/2016 0438    Assessment/Plan: 1.  AKI/CKD- in setting of volume depletion due to C Diff colitis, hypotension, ACE inhibition, Low FeNa, and poor po intake consistent with pre-renal and ischemic ATN.   1. Scr improved with isotonic bicarb due to worsening metabolic acidosis 2. No indication for HD and continue with measures to maintain BP  3. Continue to follow. 2. Metabolic acidosis- due to #1 as above change to isotonic bicarb 1. Will decrease rate to 40ml/min and follow 3. Bradycardia- h/o A fib.  Hold digoxin for now in light of AKI.  Digoxin level was therapeutic but recommend cardiology evaluation for further dosing. 4. C diff Colitis- on po vanco 5. A fib- as above hold digoxin for now and check levels before dosing again.  Consult cardiology in case she needs digibind. 6. Protein malnutrition- per primary 7. DM- per primary 8. AMS- possibly due to hypotension, metabolic encephalopathy, or uremia or combination.  Continue with further workup.   1. Improved with better HR and BP. 9. Disposition- pt is critically ill and is DNR, may need palliative care consult to help set  Goals/limits of care as we may need to escalate if she does not improve.   Donetta Potts, MD [employer] [phone]

## 2016-10-30 NOTE — Progress Notes (Signed)
  Echocardiogram 2D Echocardiogram has been performed.  Johny Chess 10/30/2016, 2:48 PM

## 2016-10-30 NOTE — Progress Notes (Signed)
PT Cancellation Note  Patient Details Name: Danielle Woodard MRN: [id number] DOB: 22-Feb-1941   Cancelled Treatment:    Reason Eval/Treat Not Completed: Patient not medically ready. Spoke with pt's RN, [name]. Will check back tomorrow.    [place] 10/30/2016, 2:03 PM

## 2016-10-30 NOTE — Progress Notes (Signed)
ANTICOAGULATION CONSULT NOTE - Initial Consult  Pharmacy Consult for Coumadin Indication: atrial fibrillation  No Known Allergies  Patient Measurements: Weight: 240 lb 11.2 oz (109.2 kg)  Vital Signs: Temp: 97.6 F (36.4 C) (12/10 0730) Temp Source: Oral (12/10 0730) BP: 92/43 (12/10 0800) Pulse Rate: 65 (12/10 0800)  Labs:  Recent Labs  10/29/16 0406 10/29/16 0713 10/29/16 1224 10/30/16 0438  HGB 13.7  --   --  13.1  HCT 42.1  --   --  39.9  PLT 308  --   --  251  LABPROT  --  36.4*  --  45.7*  INR  --  3.55  --  4.79*  CREATININE 4.33*  --   --  3.83*  CKTOTAL  --   --  59  --     CrCl cannot be calculated (Unknown ideal weight.).   Medical History: Past Medical History:  Diagnosis Date  . CHF (congestive heart failure) ([place])   . Chronic kidney disease   . Diabetes mellitus without complication ([place])   . Dysrhythmia   . Hypertension   . Hypothyroidism     Home Medications: Albuterol  Allopurinol  Digoxin  Lasix  Neurontin  Zestril  Pravachol Oscal-D  Novolog 70/30  Lopressor  Tricor  Cipro  Synthroid  Ultram    Coumadin 1 mg daily except 2 mg Wed/Sat per 08/2016 discharge summary   Assessment: 75 y.o. female transferred from [facility] with ARF and CDiff colitis, h/o Afib, to continue Coumadin.   Admitted to [facility] 12/5 with supratheraputic INR of 9.5.  Received Vit K 5 mg orally. INR 4.6 12/6, 2.2 on 12/7, and 2.6 on 12/8.  Coumadin 2 mg daily restarted and was given 12/7 and 12/8.  Yesterday (12/9) waited for INR to result before addressing coumadin dosing. INR has been SUPRAtherapeutic since that time and no doses have been ordered.  Goal of Therapy:  INR 2-3 Monitor platelets by anticoagulation protocol: Yes   Plan:  Continue to hold coumadin F/U daily INR  Melburn Popper 10/30/2016,9:17 AM

## 2016-10-30 NOTE — Progress Notes (Signed)
Patient ID: Danielle Woodard, female   DOB: 03-02-1941, 75 y.o.   MRN: [id number]    PROGRESS NOTE    Jonise Beitler  [id number] DOB: Feb 25, 1941 DOA: 10/28/2016  PCP: No primary care provider on file.   Brief Narrative:  76 y.o. female with a past medical history significant for Afib on warfarin, IDDM, HTN, CKD III baseline Cr 1.3, hypothyroidism, and CHF unknown EF who was transferred to [facility] for AKI. The patient underwent surgical repair of a right ankle fracture 3 weeks ago, uneventful post-op course, discharged to SNF for rehab.  Now in last 1-2 weeks, has had progressive watery diarrhea until three days PTA and was brought to [facility] ER for vomiting and new confusion. There, she was found to have WBC 26K, Cr 4.1 (baseline 1.3), Na 130, INR 9.5, C diff toxin positive, T 97.9, HR 86, BP 103/52, lactic acid 1.3.  Was given Flagyl for Cdiff and fluids for AKI and admitted to [facility] for further management.    Outside hospital course: - Cr trend 4.1 --> 4.1 --> 4.6 --> 4.2, oliguric - WBC trend 26K --> 24.5K --> 25K --> 18.8K - Urine culture grew enterococcus and so she was started on ampicillin there - She was transitioned to oral vancomycin 125 mg QID on HD 2 there - FeNA was 0.5 and UA was bland - due to persistent renal failure, the case was discussed with Nephrology at [facility] who agreed to evaluate the patient on transfer  On arrival of [facility] to pick up the patient 12/09, the patient was hypotensive to 70/40 by [place] and 80/50 manually.  [facility] team discussed with on-call hospitalist here who recommended PCCM consult before transfer.  PCCM recommended discharging hospitalist re-evaluation, although by this time the patient was already en route.   Assessment & Plan:   Acute kidney failure, metabolic acidosis  - in setting of volume depletion due to C Diff colitis, hypotension, ACE inhibition, poor po intake  - consistent with pre-renal and ischemic ATN - no indication  for HD at this time - appreciate nephrology team following - change IVF to isotonic bicarb per nephrology team, Cr trending down from 4.3 --> 3.83 - BMP in AM  C diff colitis - Continue vancomycin, increased to 500 QID given hypotension - advance diet as tolerated  Metabolic encephalopathy - secondary to the above, AKI, colitis, dehydration  - Hold gabapentin - Minimize oxycodone - pt more alert this AM, eating breakfast  Right ankle fracture - Appears clean dry and intact - PT eval once pt more medically stable   CHF, unspecified type:  - EF unknown.  Chart history - ECHO pending   IDDM with long term use insulin  - Hold home 70/30 - Glargine 30 units daily - added SSI   HTN, essential   - Hold metoprolol given hypotension - Hold ACEi given AKI - Continue statin  Chronic atrial fibrillation, bradycardia  - CHADS2Vasc 6 - coumadin per pharmacy, INR supratherapeutic this AM - Hold metoprolol given hypotension and bradycardia, SBP and HR actually stable this AM and within target range  - hold digoxin   DVT prophylaxis: SCD's, Coumadin per pharmacy  Code Status: DNR Family Communication: Patient at bedside  Disposition Plan: Suspect pt can go to telemetry bed   Consultants:   Nephrology   Procedures:   None  Antimicrobials:   Ampicillin  Oral vancomycin   Subjective: No events overnight.   Objective: Vitals:   10/30/16 0730 10/30/16 0800 10/30/16 1136 10/30/16 1200  BP: (!) 99/47 (!) 92/43 (!) 132/50 (!) 134/53  Pulse: 63 65 66 69  Resp: 12 13 14 16   Temp: 97.6 F (36.4 C)     TempSrc: Oral     SpO2: 93% 94% 95% 91%  Weight:        Intake/Output Summary (Last 24 hours) at 10/30/16 1211 Last data filed at 10/30/16 1200  Gross per 24 hour  Intake             3580 ml  Output              600 ml  Net             2980 ml   Filed Weights   10/28/16 2304  Weight: 109.2 kg (240 lb 11.2 oz)    Examination:  General exam: Appears calm  and comfortable  Respiratory system: Respiratory effort normal. Cardiovascular system: IRRR. No JVD, rubs, gallops or clicks.  Gastrointestinal system: Abdomen is nondistended, soft and nontender. No organomegaly or masses felt. Normal bowel sounds heard. Central nervous system: Alert and oriented.  Data Reviewed: I have personally reviewed following labs and imaging studies  CBC:  Recent Labs Lab 10/29/16 0406 10/30/16 0438  WBC 21.2* 10.7*  HGB 13.7 13.1  HCT 42.1 39.9  MCV 91.5 88.9  PLT 308 [id number]   Basic Metabolic Panel:  Recent Labs Lab 10/29/16 0406 10/30/16 0438  NA 138 139  K 4.1 3.6  CL 111 106  CO2 16* 22  GLUCOSE 116* 79  BUN 100* 98*  CREATININE 4.33* 3.83*  CALCIUM 7.6* 7.9*  PHOS  --  6.8*   Liver Function Tests:  Recent Labs Lab 10/29/16 0406 10/30/16 0438  AST 23  --   ALT 13*  --   ALKPHOS 76  --   BILITOT 0.6  --   PROT 4.1*  --   ALBUMIN 1.6* 1.5*   Coagulation Profile:  Recent Labs Lab 10/29/16 0713 10/30/16 0438  INR 3.55 4.79*   CBG:  Recent Labs Lab 10/29/16 1147 10/29/16 1557 10/29/16 2133 10/30/16 0741 10/30/16 1136  GLUCAP 99 82 104* 95 132*   Urine analysis:    Component Value Date/Time   COLORURINE AMBER (A) 10/29/2016 0057   APPEARANCEUR CLOUDY (A) 10/29/2016 0057   LABSPEC 1.016 10/29/2016 0057   PHURINE 5.0 10/29/2016 0057   GLUCOSEU NEGATIVE 10/29/2016 0057   HGBUR LARGE (A) 10/29/2016 0057   BILIRUBINUR NEGATIVE 10/29/2016 0057   KETONESUR NEGATIVE 10/29/2016 0057   PROTEINUR 30 (A) 10/29/2016 0057   NITRITE NEGATIVE 10/29/2016 0057   LEUKOCYTESUR MODERATE (A) 10/29/2016 0057   Radiology Studies: US Renal  Result Date: 10/29/2016 CLINICAL DATA:  Acute renal failure EXAM: RENAL / URINARY TRACT ULTRASOUND COMPLETE COMPARISON:  None available FINDINGS: Right Kidney: Length: 15.1 cm. Very difficult to visualize sonographically. No gross hydronephrosis. Hypoechoic right renal cyst noted, largest measures  7.1 x 7.4 x 8.2 cm. Second right renal cyst measures 4.0 x 5.2 x 3.8 cm. Left Kidney: Length: 11.8 cm. Scattered hypoechoic exophytic left renal cysts (3 visualized). Largest in the upper pole measures 3.8 cm. Mid and lower pole cysts measure 1.7 and 1.6 cm respectively. No hydronephrosis. Normal echogenicity. Bladder: Collapsed.  Not visualized. Additional findings: Left pleural effusion demonstrated. Hepatic calcification noted along the liver hilum measuring 1.7 cm suspicious for hepatic granuloma. No upper abdominal free fluid. IMPRESSION: Limited visualization of the right kidney secondary to body habitus. No gross hydronephrosis or acute process Bilateral renal cysts Completely  collapsed bladder Left pleural effusion Electronically Signed   By: Jerilynn Mages.  Shick M.D.   On: 10/29/2016 09:17    Scheduled Meds: . ampicillin (OMNIPEN) IV  1 g Intravenous Q12H  . digoxin  0.125 mg Oral Daily  . insulin aspart  0-5 Units Subcutaneous QHS  . insulin aspart  0-9 Units Subcutaneous TID WC  . insulin glargine  30 Units Subcutaneous Daily  . levothyroxine  25 mcg Oral QAC breakfast  . pravastatin  40 mg Oral Daily  . sodium chloride flush  3 mL Intravenous Q12H  . vancomycin  500 mg Oral Q6H   Continuous Infusions: .  sodium bicarbonate 150 mEq in sterile water 1000 mL infusion 150 mEq (10/30/16 1200)     LOS: 2 days   Time spent: 20 minutes   Faye Ramsay, MD [facility] Pager [phone]  If 7PM-7AM, please contact night-coverage [email or web address] Password [email or web address] 10/30/2016, 12:11 PM

## 2016-10-30 NOTE — Progress Notes (Signed)
Notified MD about elevated INR no orders received and pharmacy managing coumadin.

## 2016-10-30 NOTE — Progress Notes (Signed)
Spoke with Dr Doyle Askew about digoxin. Blood pressure is 90's/40's and heart rate is a-fib in 60's. MD told this RN to hold todays dose. Consuelo Pandy RN

## 2016-10-31 LAB — RENAL FUNCTION PANEL
ALBUMIN: 1.5 g/dL — AB (ref 3.5–5.0)
Anion gap: 10 (ref 5–15)
BUN: 90 mg/dL — ABNORMAL HIGH (ref 6–20)
CHLORIDE: 104 mmol/L (ref 101–111)
CO2: 26 mmol/L (ref 22–32)
CREATININE: 3.17 mg/dL — AB (ref 0.44–1.00)
Calcium: 8.1 mg/dL — ABNORMAL LOW (ref 8.9–10.3)
GFR, EST AFRICAN AMERICAN: 15 mL/min — AB (ref >60)
GFR, EST NON AFRICAN AMERICAN: 13 mL/min — AB (ref >60)
Glucose, Bld: 67 mg/dL (ref 65–99)
PHOSPHORUS: 5.3 mg/dL — AB (ref 2.5–4.6)
POTASSIUM: 3.2 mmol/L — AB (ref 3.5–5.1)
Sodium: 140 mmol/L (ref 135–145)

## 2016-10-31 LAB — CBC
HEMATOCRIT: 38.1 % (ref 36.0–46.0)
Hemoglobin: 12.4 g/dL (ref 12.0–15.0)
MCH: 28.8 pg (ref 26.0–34.0)
MCHC: 32.5 g/dL (ref 30.0–36.0)
MCV: 88.4 fL (ref 78.0–100.0)
Platelets: 242 10*3/uL (ref 150–400)
RBC: 4.31 MIL/uL (ref 3.87–5.11)
RDW: 15.9 % — ABNORMAL HIGH (ref 11.5–15.5)
WBC: 9 10*3/uL (ref 4.0–10.5)

## 2016-10-31 LAB — GLUCOSE, CAPILLARY
GLUCOSE-CAPILLARY: 137 mg/dL — AB (ref 65–99)
GLUCOSE-CAPILLARY: 167 mg/dL — AB (ref 65–99)
Glucose-Capillary: 72 mg/dL (ref 65–99)
Glucose-Capillary: 122 mg/dL — ABNORMAL HIGH (ref 65–99)

## 2016-10-31 LAB — PROTIME-INR
INR: 4.07
Prothrombin Time: 40.5 seconds — ABNORMAL HIGH (ref 11.4–15.2)

## 2016-10-31 MED ORDER — ACETAMINOPHEN 500 MG PO TABS: 500.0000 mg | ORAL_TABLET | Freq: Three times a day (TID) | ORAL | Status: DC | PRN

## 2016-10-31 MED ORDER — ALBUTEROL SULFATE (2.5 MG/3ML) 0.083% IN NEBU: 2.5000 mg | INHALATION_SOLUTION | Freq: Four times a day (QID) | RESPIRATORY_TRACT | Status: DC | PRN

## 2016-10-31 MED ORDER — POTASSIUM CHLORIDE CRYS ER 20 MEQ PO TBCR
40.0000 meq | EXTENDED_RELEASE_TABLET | Freq: Once | ORAL | Status: AC
  Administered 2016-10-31: 40 meq via ORAL
  Filled 2016-10-31: qty 2

## 2016-10-31 MED ORDER — SENNOSIDES-DOCUSATE SODIUM 8.6-50 MG PO TABS
1.0000 | ORAL_TABLET | Freq: Two times a day (BID) | ORAL | Status: DC
  Administered 2016-11-02: 1 via ORAL
  Filled 2016-10-31 (×4): qty 1

## 2016-10-31 MED ORDER — WARFARIN - PHARMACIST DOSING INPATIENT: Freq: Every day | Status: DC

## 2016-10-31 MED ORDER — METOPROLOL TARTRATE 12.5 MG HALF TABLET
12.5000 mg | ORAL_TABLET | Freq: Two times a day (BID) | ORAL | Status: DC
  Administered 2016-10-31 – 2016-11-03 (×6): 12.5 mg via ORAL
  Filled 2016-10-31 (×6): qty 1

## 2016-10-31 NOTE — Progress Notes (Addendum)
Patient ID: Danielle Woodard, female   DOB: [birth year], 74 y.o.   MRN: [id number]    PROGRESS NOTE    Danielle Woodard  [id number] DOB: June 08, 1941 DOA: 10/28/2016  PCP: No primary care provider on file.   Brief Narrative:  75 y.o. female with a past medical history significant for Afib on warfarin, IDDM, HTN, CKD III baseline Cr 1.3, hypothyroidism, and CHF unknown EF who was transferred to [facility] for AKI. The patient underwent surgical repair of a right ankle fracture 3 weeks ago, uneventful post-op course, discharged to SNF for rehab.  Now in last 1-2 weeks, has had progressive watery diarrhea until three days PTA and was brought to [facility] ER for vomiting and new confusion. There, she was found to have WBC 26K, Cr 4.1 (baseline 1.3), Na 130, INR 9.5, C diff toxin positive, T 97.9, HR 86, BP 103/52, lactic acid 1.3.  Was given Flagyl for Cdiff and fluids for AKI and admitted to [facility] for further management.    Outside hospital course: - Cr trend 4.1 --> 4.1 --> 4.6 --> 4.2, oliguric - WBC trend 26K --> 24.5K --> 25K --> 18.8K - Urine culture grew enterococcus and so she was started on ampicillin there - She was transitioned to oral vancomycin 125 mg QID on HD 2 there - FeNA was 0.5 and UA was bland - due to persistent renal failure, the case was discussed with Nephrology at [facility] who agreed to evaluate the patient on transfer  On arrival of [facility] to pick up the patient 12/09, the patient was hypotensive to 70/40 by [place] and 80/50 manually.  [facility] team discussed with on-call hospitalist here who recommended PCCM consult before transfer.  PCCM recommended discharging hospitalist re-evaluation, although by this time the patient was already en route.   Assessment & Plan:   Acute kidney failure, metabolic acidosis  - in setting of volume depletion due to C Diff colitis, hypotension, ACE inhibition, poor po intake  - consistent with pre-renal and ischemic ATN - no indication  for HD at this time - appreciate nephrology team following - continue isotonic bicarb per nephrology team, Cr trending down from 4.3 --> 3.83 --> 3.17 - BMP in AM  C diff colitis - Continue vancomycin, increased to 500 QID given hypotension - advance diet as tolerated  Enterococcus UTI - continue Ampicillin   Metabolic encephalopathy - secondary to the above, AKI, colitis, dehydration  - resolved   Right ankle fracture - Appears clean dry and intact - PT eval once pt more medically stable   CHF, unspecified type:  - EF unknown - ECHO with EF 65% but study not technically sufficient to evaluate diastolic function - based on clinical presentation, suspect diastolic CHF - monitor daily weights, weight on admission 240 lbs - strict I/O  IDDM with long term use insulin  - Hold home 70/30 - Glargine 30 units daily - added SSI   HTN, essential   - Hold ACEi given AKI - Continue statin - resume home metoprolol but at the lower dose, at home on 50 mg PO BID but will start 12. 5 mg PO BID today   Chronic atrial fibrillation, bradycardia  - CHADS2Vasc 6 - coumadin per pharmacy, INR supratherapeutic this AM - Held metoprolol given hypotension and bradycardia, SBP and HR actually stable this AM and within target range  - will resume Metoprolol today 12/11 - hold digoxin   DVT prophylaxis: SCD's, Coumadin per pharmacy  Code Status: DNR Family Communication: Patient at bedside  Disposition Plan: transfer to tele in AM if BP and HR stable   Consultants:   Nephrology   Procedures:   None  Antimicrobials:   Ampicillin  Oral vancomycin   Subjective: No events overnight.   Objective: Vitals:   10/31/16 0441 10/31/16 0723 10/31/16 1116 10/31/16 1501  BP: 122/64 (!) 114/55 (!) 140/59 (!) 141/60  Pulse: 68 72 71 79  Resp: 16 19 18 17   Temp: 98.2 F (36.8 C) 98.2 F (36.8 C) 97.7 F (36.5 C) 98.1 F (36.7 C)  TempSrc: Oral Oral Oral Oral  SpO2: 93% 93% 95%  92%  Weight:        Intake/Output Summary (Last 24 hours) at 10/31/16 1924 Last data filed at 10/31/16 1500  Gross per 24 hour  Intake             1480 ml  Output             1250 ml  Net              230 ml   Filed Weights   10/28/16 2304  Weight: 109.2 kg (240 lb 11.2 oz)    Examination:  General exam: Appears calm and comfortable  Respiratory system: Respiratory effort normal. Cardiovascular system: IRRR. No JVD, rubs, gallops or clicks.  Gastrointestinal system: Abdomen is nondistended, soft and nontender. No organomegaly or masses felt. Normal bowel sounds heard. Central nervous system: Alert and oriented.  Data Reviewed: I have personally reviewed following labs and imaging studies  CBC:  Recent Labs Lab 10/29/16 0406 10/30/16 0438 10/31/16 0419  WBC 21.2* 10.7* 9.0  HGB 13.7 13.1 12.4  HCT 42.1 39.9 38.1  MCV 91.5 88.9 88.4  PLT 308 251 [id number]   Basic Metabolic Panel:  Recent Labs Lab 10/29/16 0406 10/30/16 0438 10/31/16 0419  NA 138 139 140  K 4.1 3.6 3.2*  CL 111 106 104  CO2 16* 22 26  GLUCOSE 116* 79 67  BUN 100* 98* 90*  CREATININE 4.33* 3.83* 3.17*  CALCIUM 7.6* 7.9* 8.1*  PHOS  --  6.8* 5.3*   Liver Function Tests:  Recent Labs Lab 10/29/16 0406 10/30/16 0438 10/31/16 0419  AST 23  --   --   ALT 13*  --   --   ALKPHOS 76  --   --   BILITOT 0.6  --   --   PROT 4.1*  --   --   ALBUMIN 1.6* 1.5* 1.5*   Coagulation Profile:  Recent Labs Lab 10/29/16 0713 10/30/16 0438 10/31/16 0419  INR 3.55 4.79* 4.07*   CBG:  Recent Labs Lab 10/30/16 1653 10/30/16 2111 10/31/16 0726 10/31/16 1116 10/31/16 1720  GLUCAP 100* 113* 72 137* 122*   Urine analysis:    Component Value Date/Time   COLORURINE AMBER (A) 10/29/2016 0057   APPEARANCEUR CLOUDY (A) 10/29/2016 0057   LABSPEC 1.016 10/29/2016 0057   PHURINE 5.0 10/29/2016 0057   GLUCOSEU NEGATIVE 10/29/2016 0057   HGBUR LARGE (A) 10/29/2016 0057   BILIRUBINUR NEGATIVE  10/29/2016 Danielle Woodard 10/29/2016 0057   PROTEINUR 30 (A) 10/29/2016 0057   NITRITE NEGATIVE 10/29/2016 0057   LEUKOCYTESUR MODERATE (A) 10/29/2016 0057   Radiology Studies: No results found.  Scheduled Meds: . ampicillin (OMNIPEN) IV  1 g Intravenous Q12H  . digoxin  0.125 mg Oral Daily  . insulin aspart  0-5 Units Subcutaneous QHS  . insulin aspart  0-9 Units Subcutaneous TID WC  . insulin glargine  30  Units Subcutaneous Daily  . levothyroxine  25 mcg Oral QAC breakfast  . pravastatin  40 mg Oral Daily  . sodium chloride flush  3 mL Intravenous Q12H  . vancomycin  500 mg Oral Q6H  . Warfarin - Pharmacist Dosing Inpatient   Does not apply q1800   Continuous Infusions: .  sodium bicarbonate 150 mEq in sterile water 1000 mL infusion 150 mEq (10/31/16 0931)     LOS: 3 days   Time spent: 20 minutes   Faye Ramsay, MD [facility] Pager [phone]  If 7PM-7AM, please contact night-coverage [email or web address] Password [facility] 10/31/2016, 7:24 PM

## 2016-10-31 NOTE — Plan of Care (Signed)
Problem: Activity: Goal: Risk for activity intolerance will decrease Outcome: Progressing PT consulted.

## 2016-10-31 NOTE — Progress Notes (Signed)
ANTICOAGULATION CONSULT NOTE  Pharmacy Consult for Coumadin Indication: atrial fibrillation  No Known Allergies  Patient Measurements: Weight: 240 lb 11.2 oz (109.2 kg)  Vital Signs: Temp: 98.2 F (36.8 C) (12/11 0723) Temp Source: Oral (12/11 0723) BP: 114/55 (12/11 0723) Pulse Rate: 72 (12/11 0723)  Labs:  Recent Labs  10/29/16 0406 10/29/16 0713 10/29/16 1224 10/30/16 0438 10/31/16 0419  HGB 13.7  --   --  13.1 12.4  HCT 42.1  --   --  39.9 38.1  PLT 308  --   --  251 242  LABPROT  --  36.4*  --  45.7* 40.5*  INR  --  3.55  --  4.79* 4.07*  CREATININE 4.33*  --   --  3.83* 3.17*  CKTOTAL  --   --  59  --   --     CrCl cannot be calculated (Unknown ideal weight.).   Medical History: Past Medical History:  Diagnosis Date  . CHF (congestive heart failure) ([place])   . Chronic kidney disease   . Diabetes mellitus without complication ([place])   . Dysrhythmia   . Hypertension   . Hypothyroidism       PTA Coumadin dose: Coumadin 1 mg daily except 2 mg Wed/Sat per 08/2016 discharge summary   Assessment: 75 y.o. female transferred from [facility] with ARF and CDiff colitis, h/o Afib, to continue Coumadin.   Admitted to [facility] 12/5 with supratheraputic INR of 9.5.  Received Vit K 5 mg orally. INR 4.6 12/6, 2.2 on 12/7, and 2.6 on 12/8.  Coumadin 2 mg daily restarted and was given 12/7 and 12/8.  INR has been SUPRAtherapeutic since that time and no doses have been ordered.  Today's INR remains elevated.  No bleeding or complications noted.  CBC stable.  Goal of Therapy:  INR 2-3 Monitor platelets by anticoagulation protocol: Yes   Plan:  No Coumadin tonight. Daily PT/INR.  Uvaldo Rising, BCPS  Clinical Pharmacist Pager [phone]  10/31/2016 11:38 AM

## 2016-10-31 NOTE — Progress Notes (Signed)
Admit: 10/28/2016 LOS: 3  40F AoCKD3 (BL 1.3-1.5) from hypovolemia (CDI) + ACEi + Hypotension  Subjective:  BUN and SCr trending down; UOP > 1L Serum HCO3 up to 26 K 3.2  WBC trending down Working with PT In good spirits  12/10 0701 - 12/11 0700 In: 2269.2 [P.O.:660; I.V.:1509.2; IV Piggyback:100] Out: 1125 [Urine:1125]  Filed Weights   10/28/16 2304  Weight: 109.2 kg (240 lb 11.2 oz)    Scheduled Meds: . ampicillin (OMNIPEN) IV  1 g Intravenous Q12H  . digoxin  0.125 mg Oral Daily  . insulin aspart  0-5 Units Subcutaneous QHS  . insulin aspart  0-9 Units Subcutaneous TID WC  . insulin glargine  30 Units Subcutaneous Daily  . levothyroxine  25 mcg Oral QAC breakfast  . pravastatin  40 mg Oral Daily  . sodium chloride flush  3 mL Intravenous Q12H  . vancomycin  500 mg Oral Q6H   Continuous Infusions: .  sodium bicarbonate 150 mEq in sterile water 1000 mL infusion 150 mEq (10/31/16 0600)   PRN Meds:.acetaminophen **OR** acetaminophen, ondansetron **OR** ondansetron (ZOFRAN) IV, oxyCODONE  Current Labs: reviewed    Physical Exam:  Blood pressure 122/64, pulse 68, temperature 98.2 F (36.8 C), temperature source Oral, resp. rate 16, weight 109.2 kg (240 lb 11.2 oz), SpO2 93 %. IRIR, nl rate CTAB, diminishedi n bases No LEE AAO Nonfocal Obese, NAD  A 1. AoCKD3 (BL 1.3-1.5) 2/2 hypovolemia, ACEi use 2. Metabolic Acidosis 3. CDI on PO Vanc 4. DM2 5. AFib on warfarin, digoxin 6. OSH Enterococcal UTI on ampicillin 7. R ankle fracture  P 1. Appears to be recovering, Cont IVFs for another 24h 2. Encourage PO intake 3. No other changes now 4. WIll cont to follow.   Pearson Grippe MD 10/31/2016, 8:11 AM   Recent Labs Lab 10/29/16 0406 10/30/16 0438 10/31/16 0419  NA 138 139 140  K 4.1 3.6 3.2*  CL 111 106 104  CO2 16* 22 26  GLUCOSE 116* 79 67  BUN 100* 98* 90*  CREATININE 4.33* 3.83* 3.17*  CALCIUM 7.6* 7.9* 8.1*  PHOS  --  6.8* 5.3*    Recent  Labs Lab 10/29/16 0406 10/30/16 0438 10/31/16 0419  WBC 21.2* 10.7* 9.0  HGB 13.7 13.1 12.4  HCT 42.1 39.9 38.1  MCV 91.5 88.9 88.4  PLT 308 251 242

## 2016-10-31 NOTE — Evaluation (Signed)
Physical Therapy Evaluation Patient Details Name: Danielle Woodard MRN: [id number] DOB: 1941/05/07 Today's Date: 10/31/2016   History of Present Illness  Danielle Woodard is a 75 y.o. female with a PMH significant for Afib on warfarin, IDDM, HTN, CKD III baseline Cr 1.3, hypothyroidism, and CHF unknown EF who was transferred to [facility] for AKI. Pt with recent ankle fx ~ 3 weeks ago per chart resulting in rehab stay.   Clinical Impression  Patient presents with generalized weakness, deconditioning, impaired sitting balance, and impaired mobility s/p above. Tolerated sitting EOB with Mod A due to right lateral lean. Requires assist of 2 to power to standing with CAM boot donned RLE. Pt from SNF getting therapy after ankle fx. Would benefit from return to SNF to maximize independence and mobility prior to return home. Will follow acutely.    Follow Up Recommendations SNF    Equipment Recommendations  None recommended by PT    Recommendations for Other Services OT consult     Precautions / Restrictions Precautions Precautions: Fall Required Braces or Orthoses: Other Brace/Splint Other Brace/Splint: CAM boot RLE Restrictions Weight Bearing Restrictions: Yes RLE Weight Bearing: Weight bearing as tolerated Other Position/Activity Restrictions: in CAM boot.      Mobility  Bed Mobility Overal bed mobility: Needs Assistance Bed Mobility: Supine to Sit;Sit to Supine     Supine to sit: Max assist;HOB elevated Sit to supine: Max assist;+2 for physical assistance   General bed mobility comments: Assist to bring LEs to EOB, scoot bottom to EOB and elevate trunk. Assist to bring LEs into bed to return to supine.  Transfers Overall transfer level: Needs assistance Equipment used: Rolling walker (2 wheeled) Transfers: Sit to/from Stand Sit to Stand: Max assist;+2 physical assistance;From elevated surface         General transfer comment: Assist of 2 to power to standing with cues for  hand placement/technique. Cues for foot placement and anterior translation.   Ambulation/Gait                Stairs            Wheelchair Mobility    Modified Rankin (Stroke Patients Only)       Balance Overall balance assessment: Needs assistance Sitting-balance support: Feet supported;Bilateral upper extremity supported Sitting balance-Leahy Scale: Fair Sitting balance - Comments: Mod A sitting EOB with right lateral lean, dificulty obtaining upright due to hump in bed, fatigue. Postural control: Right lateral lean Standing balance support: During functional activity;Bilateral upper extremity supported Standing balance-Leahy Scale: Poor Standing balance comment: Requires assist of 2 for standing balance with BUE support on RW.                              Pertinent Vitals/Pain Pain Assessment: No/denies pain    Home Living Family/patient expects to be discharged to:: Skilled nursing facility Living Arrangements: Alone                    Prior Function           Comments: Reports taking a few steps at rehab in her boot.     Hand Dominance        Extremity/Trunk Assessment   Upper Extremity Assessment: Defer to OT evaluation           Lower Extremity Assessment: Generalized weakness (Swelling present BLEs, esp in feet/ankles.)      Cervical / Trunk Assessment: Kyphotic  Communication  Communication: No difficulties  Cognition Arousal/Alertness: Awake/alert Behavior During Therapy: WFL for tasks assessed/performed Overall Cognitive Status: Within Functional Limits for tasks assessed                      General Comments General comments (skin integrity, edema, etc.): VSS stable throughout.    Exercises     Assessment/Plan    PT Assessment Patient needs continued PT services  PT Problem List Decreased strength;Decreased mobility;Obesity;Decreased activity tolerance;Decreased balance;Decreased skin  integrity          PT Treatment Interventions Therapeutic activities;Gait training;Patient/family education;Balance training;Functional mobility training;Therapeutic exercise;Wheelchair mobility training    PT Goals (Current goals can be found in the Care Plan section)  Acute Rehab PT Goals Patient Stated Goal: return to rehab to get stronger PT Goal Formulation: With patient Time For Goal Achievement: 11/14/16 Potential to Achieve Goals: Good    Frequency Min 2X/week   Barriers to discharge        Co-evaluation               End of Session Equipment Utilized During Treatment: Gait belt Activity Tolerance: Patient limited by fatigue Patient left: in bed;with call bell/phone within reach;with bed alarm set Nurse Communication: Mobility status         Time: [id number] PT Time Calculation (min) (ACUTE ONLY): 25 min   Charges:   PT Evaluation $PT Eval Moderate Complexity: 1 Procedure PT Treatments $Therapeutic Activity: 8-22 mins   PT G Codes:        Rino Hosea A Pasqual Farias 10/31/2016, 9:10 AM Wray Kearns, PT, DPT [phone]

## 2016-11-01 LAB — BASIC METABOLIC PANEL
ANION GAP: 8 (ref 5–15)
BUN: 75 mg/dL — ABNORMAL HIGH (ref 6–20)
CALCIUM: 8.5 mg/dL — AB (ref 8.9–10.3)
CO2: 29 mmol/L (ref 22–32)
Chloride: 105 mmol/L (ref 101–111)
Creatinine, Ser: 2.45 mg/dL — ABNORMAL HIGH (ref 0.44–1.00)
GFR, EST AFRICAN AMERICAN: 21 mL/min — AB (ref >60)
GFR, EST NON AFRICAN AMERICAN: 18 mL/min — AB (ref >60)
Glucose, Bld: 52 mg/dL — ABNORMAL LOW (ref 65–99)
POTASSIUM: 4.1 mmol/L (ref 3.5–5.1)
SODIUM: 142 mmol/L (ref 135–145)

## 2016-11-01 LAB — CBC
HCT: 39.1 % (ref 36.0–46.0)
Hemoglobin: 12.6 g/dL (ref 12.0–15.0)
MCH: 29 pg (ref 26.0–34.0)
MCHC: 32.2 g/dL (ref 30.0–36.0)
MCV: 90.1 fL (ref 78.0–100.0)
PLATELETS: 236 10*3/uL (ref 150–400)
RBC: 4.34 MIL/uL (ref 3.87–5.11)
RDW: 16.3 % — AB (ref 11.5–15.5)
WBC: 9.1 10*3/uL (ref 4.0–10.5)

## 2016-11-01 LAB — GLUCOSE, CAPILLARY
GLUCOSE-CAPILLARY: 51 mg/dL — AB (ref 65–99)
GLUCOSE-CAPILLARY: 62 mg/dL — AB (ref 65–99)
GLUCOSE-CAPILLARY: 123 mg/dL — AB (ref 65–99)
GLUCOSE-CAPILLARY: 98 mg/dL (ref 65–99)
Glucose-Capillary: 133 mg/dL — ABNORMAL HIGH (ref 65–99)

## 2016-11-01 LAB — PROTIME-INR
INR: 3.64
Prothrombin Time: 37.1 seconds — ABNORMAL HIGH (ref 11.4–15.2)

## 2016-11-01 MED ORDER — INSULIN GLARGINE 100 UNIT/ML ~~LOC~~ SOLN
20.0000 [IU] | Freq: Every day | SUBCUTANEOUS | Status: DC
  Administered 2016-11-02 – 2016-11-03 (×2): 20 [IU] via SUBCUTANEOUS
  Filled 2016-11-01 (×2): qty 0.2

## 2016-11-01 NOTE — NC FL2 (Signed)
[place] MEDICAID FL2 LEVEL OF CARE SCREENING TOOL     IDENTIFICATION  Patient Name: Danielle Woodard Birthdate: [birth year] Sex: female Admission Date (Current Location): 10/28/2016  [facility] and [place] Number:  [employer] and Address:  [facility] [place] [place] [address] [place], [place] [address]      Provider Number: [id number]  Attending Physician Name and Address:  Theodis Blaze, MD  Relative Name and Phone Number:       Current Level of Care: Hospital Recommended Level of Care: [place] Prior Approval Number:    Date Approved/Denied:   PASRR Number: [id number]  Discharge Plan: SNF    Current Diagnoses: Patient Active Problem List   Diagnosis Date Noted  . Pressure injury of skin 10/29/2016  . Clostridium difficile colitis 10/28/2016  . CHF NYHA class I, unspecified failure chronicity, unspecified type ([place]) 10/28/2016  . AKI (acute kidney injury) ([place]) 10/28/2016  . Metabolic encephalopathy [id number]  . Closed right ankle fracture, with routine healing, subsequent encounter 10/28/2016  . Type 2 diabetes mellitus with stage 3 chronic kidney disease, with long-term current use of insulin ([place]) 10/28/2016  . Essential hypertension 10/28/2016  . Chronic atrial fibrillation ([place]) 10/28/2016  . Hypothyroidism, acquired 10/28/2016    Orientation RESPIRATION BLADDER Height & Weight     Self, Time, Situation, Place  O2 (Nasal Canula 2 L) Indwelling catheter Weight: 240 lb 11.9 oz (109.2 kg) Height:     BEHAVIORAL SYMPTOMS/MOOD NEUROLOGICAL BOWEL NUTRITION STATUS   (None)  (None) Incontinent Diet (Heart healthy/carb modified)  AMBULATORY STATUS COMMUNICATION OF NEEDS Skin   Extensive Assist Verbally Other (Comment) (Blister, MASD, Pressure injury stage II posterior sacrum foam prn, open/dehicsed wound/incision right ankle foam prn)                       Personal Care Assistance Level of Assistance  Bathing,  Feeding, Dressing Bathing Assistance: Maximum assistance Feeding assistance: Limited assistance Dressing Assistance: Maximum assistance     Functional Limitations Info  Sight, Hearing, Speech Sight Info: Adequate Hearing Info: Adequate Speech Info: Adequate    SPECIAL CARE FACTORS FREQUENCY  PT (By licensed PT), Blood pressure, Diabetic urine testing, OT (By licensed OT)     PT Frequency: 5 x week OT Frequency: 5 x week            Contractures Contractures Info: Not present    Additional Factors Info  Code Status, Allergies, Isolation Precautions Code Status Info: DNR Allergies Info: NKDA     Isolation Precautions Info: Enteric precautions: Cdiff     Current Medications (11/01/2016):  This is the current hospital active medication list Current Facility-Administered Medications  Medication Dose Route Frequency Provider Last Rate Last Dose  . acetaminophen (TYLENOL) tablet 650 mg  650 mg Oral Q6H PRN Edwin Dada, MD       Or  . acetaminophen (TYLENOL) suppository 650 mg  650 mg Rectal Q6H PRN Edwin Dada, MD      . albuterol (PROVENTIL) (2.5 MG/3ML) 0.083% nebulizer solution 2.5 mg  2.5 mg Inhalation QID PRN Theodis Blaze, MD      . ampicillin (OMNIPEN) 1 g in sodium chloride 0.9 % 50 mL IVPB  1 g Intravenous Q12H Edwin Dada, MD   1 g at 11/01/16 1057  . insulin aspart (novoLOG) injection 0-5 Units  0-5 Units Subcutaneous QHS Edwin Dada, MD      . insulin aspart (novoLOG) injection  0-9 Units  0-9 Units Subcutaneous TID WC Edwin Dada, MD   1 Units at 10/31/16 1723  . [START ON 11/02/2016] insulin glargine (LANTUS) injection 20 Units  20 Units Subcutaneous Daily Theodis Blaze, MD      . levothyroxine (SYNTHROID, LEVOTHROID) tablet 25 mcg  25 mcg Oral QAC breakfast Edwin Dada, MD   25 mcg at 11/01/16 0617  . metoprolol tartrate (LOPRESSOR) tablet 12.5 mg  12.5 mg Oral BID Theodis Blaze, MD   12.5 mg at 11/01/16  1054  . ondansetron (ZOFRAN) tablet 4 mg  4 mg Oral Q6H PRN Edwin Dada, MD       Or  . ondansetron (ZOFRAN) injection 4 mg  4 mg Intravenous Q6H PRN Edwin Dada, MD      . oxyCODONE (Oxy IR/ROXICODONE) immediate release tablet 5 mg  5 mg Oral Q6H PRN Edwin Dada, MD      . pravastatin (PRAVACHOL) tablet 40 mg  40 mg Oral Daily Edwin Dada, MD   40 mg at 11/01/16 1054  . senna-docusate (Senokot-S) tablet 1 tablet  1 tablet Oral BID Theodis Blaze, MD      . sodium chloride flush (NS) 0.9 % injection 3 mL  3 mL Intravenous Q12H Edwin Dada, MD   3 mL at 10/31/16 2223  . vancomycin (VANCOCIN) 50 mg/mL oral solution 500 mg  500 mg Oral Q6H Edwin Dada, MD   500 mg at 11/01/16 0617  . Warfarin - Pharmacist Dosing Inpatient   Does not apply [place]   Stopped at 10/31/16 1800     Discharge Medications: Please see discharge summary for a list of discharge medications.  Relevant Imaging Results:  Relevant Lab Results:   Additional Information SS#: [id number]  Candie Chroman, LCSW

## 2016-11-01 NOTE — Clinical Social Work Note (Signed)
Clinical Social Work Assessment  Patient Details  Name: Danielle Woodard MRN: [id number] Date of Birth: Nov 10, 1941  Date of referral:  11/01/16               Reason for consult:  Discharge Planning                Permission sought to share information with:  Facility Sport and exercise psychologist, Family Supports Permission granted to share information::  Yes, Verbal Permission Granted  Name::     Peggy Sluss  Agency::  [facility]  Relationship::  Sister-in-law  Contact Information:  [phone]  Housing/Transportation Living arrangements for the past 2 months:  [place] of Information:  Patient, Medical Team Patient Interpreter Needed:  None Criminal Activity/Legal Involvement Pertinent to Current Situation/Hospitalization:  No - Comment as needed Significant Relationships:  Siblings, Other Family Members Lives with:  Facility Resident Do you feel safe going back to the place where you live?  Yes Need for family participation in patient care:  Yes (Comment)  Care giving concerns:  PT recommending SNF once medically stable. Patient came from [facility].   Social Worker assessment / plan:  CSW met with patient. No supports at bedside. CSW introduced role and explained that PT recommendations would be discussed. Patient confirmed that she came from [facility] and plans to return once stable for discharge. Patient states that she will need PTAR. Patient has spoken with the chaplain about making her sister-in-law her HCPOA and is waiting on them to bring back a booklet for more information. No further concerns. CSW encouraged patient to contact CSW as needed. CSW will continue to follow patient for support and facilitate discharge back to SNF once medically stable.  Employment status:  Retired Forensic scientist:  Medicare PT Recommendations:  [place] / Referral to community resources:   [place]  Patient/Family's Response to care:  Patient agreeable to return to SNF. Patient's family supportive and involved in patient's care. Patient appreciated social work intervention.  Patient/Family's Understanding of and Emotional Response to Diagnosis, Current Treatment, and Prognosis:  Patient understands and is agreeable to discharge plan. Patient appears happy with hospital care.  Emotional Assessment Appearance:  Appears stated age Attitude/Demeanor/Rapport:  Other (Pleasant) Affect (typically observed):  Accepting, Appropriate, Calm, Pleasant Orientation:  Oriented to Self, Oriented to Place, Oriented to  Time, Oriented to Situation Alcohol / Substance use:  Never Used Psych involvement (Current and /or in the community):  No (Comment)  Discharge Needs  Concerns to be addressed:  Care Coordination Readmission within the last 30 days:  No Current discharge risk:  Dependent with Mobility Barriers to Discharge:  Continued Medical Work up   Candie Chroman, LCSW 11/01/2016, 12:50 PM

## 2016-11-01 NOTE — Progress Notes (Addendum)
Patient ID: Danielle Woodard, female   DOB: Sep 08, 1941, 75 y.o.   MRN: [id number]    PROGRESS NOTE    Danielle Woodard  [id number] DOB: [birth year] DOA: 10/28/2016  PCP: No primary care provider on file.   Brief Narrative:  75 y.o. female with a past medical history significant for Afib on warfarin, IDDM, HTN, CKD III baseline Cr 1.3, hypothyroidism, and ? CHF unknown EF who was transferred to [facility] for AKI. The patient underwent surgical repair of a right ankle fracture 3 weeks ago, uneventful post-op course, discharged to SNF for rehab.  Now in last 1-2 weeks, has had progressive watery diarrhea until three days PTA and was brought to [facility] ER for vomiting and new confusion. There, she was found to have WBC 26K, Cr 4.1 (baseline 1.3), Na 130, INR 9.5, C diff toxin positive, T 97.9, HR 86, BP 103/52, lactic acid 1.3.  Was given Flagyl for Cdiff and fluids for AKI and admitted to [facility] for further management.    Outside hospital course: - Cr trend 4.1 --> 4.1 --> 4.6 --> 4.2, oliguric - WBC trend 26K --> 24.5K --> 25K --> 18.8K - Urine culture grew enterococcus and so she was started on ampicillin there - She was transitioned to oral vancomycin 125 mg QID  - FeNA was 0.5 and UA was bland - due to persistent renal failure, the case was discussed with Nephrology at [facility] who agreed to evaluate the patient on transfer  On arrival of [facility] to pick up the patient 12/09, the patient was hypotensive to 70/40 by [place] and 80/50 manually. [facility] team discussed with on-call hospitalist here who recommended PCCM consult before transfer.  PCCM recommended discharging hospitalist re-evaluation, although by this time the patient was already en route.   Assessment & Plan:   Acute kidney failure, metabolic acidosis  - in setting of volume depletion due to C Diff colitis, hypotension, ACE inhibition, poor po intake  - consistent with pre-renal and ischemic ATN - no indication for HD at this  time - appreciate nephrology team following - continued isotonic bicarb per nephrology team, Cr trending down from 4.3 --> 3.83 --> 3.17 --> 2.45 - will stop IVF to see how pt does (stopped IVF 12/12) - since Cr trending down, nephrology team has singed off for now  - BMP in AM  C diff colitis - Continue vancomycin, increased to 500 QID given hypotension - today is day #4/14 - advance diet as tolerated  Hypokalemia  - resolved - BMP In AM  Enterococcus UTI - continue Ampicillin, today is day #4/7 therapy   Metabolic encephalopathy - secondary to the above, AKI, colitis, dehydration  - resolved  - stable for transfer to telemetry today and if no hypotension of tachycardia, suspect we can stop tele monitor in AM  Right ankle fracture - Appears clean dry and intact - PT eval done, SNF recommended, SW consult for assistance   CHF, likely acute diastolic:  - ECHO with EF 65% but study not technically sufficient to evaluate diastolic function - based on clinical presentation, suspect acute diastolic CHF - monitor daily weights, weight on admission 240 lbs and remains stable since  - strict I/O  IDDM with long term use insulin  - Hold home 70/30 - Glargine 30 units daily but some hypoglycemic events, will lower the dose to 20 U  - continue SSI   HTN, essential   - Hold ACEi given AKI - Continue statin - resume home metoprolol but at  the lower dose, at home on 50 mg PO BID but will start 12. 5 mg PO BID today  - SBP in 100/s this AM   Chronic atrial fibrillation, bradycardia  - CHADS2Vasc 6 - coumadin per pharmacy, INR monitored per pharmacy 3.64 this AM - Held metoprolol initially given hypotension and bradycardia, SBP and HR have stabilized  - resumed Metoprolol 12/11 - hold digoxin for now, may resume in 1-2 days if kidney function continues to improve   Obesity - BMI > 30   DVT prophylaxis: SCD's, Coumadin per pharmacy  Code Status: DNR Family Communication:  Patient at bedside  Disposition Plan: transfer to tele, SNF in 1-2 days depends on stability of BP, HR, kidney function tests   Consultants:   Nephrology   Procedures:   None  Antimicrobials:   Ampicillin 12/09 -->   Oral vancomycin --> 12/090  Subjective: No events overnight.   Objective: Vitals:   10/31/16 2222 10/31/16 2339 11/01/16 0320 11/01/16 0700  BP:   (!) 130/44 109/61  Pulse: 70  64 77  Resp:   18 19  Temp:  98.1 F (36.7 C) 97.9 F (36.6 C) 97.3 F (36.3 C)  TempSrc:  Oral Oral Oral  SpO2:   98% 92%  Weight:   109.2 kg (240 lb 11.9 oz)     Intake/Output Summary (Last 24 hours) at 11/01/16 1043 Last data filed at 11/01/16 0850  Gross per 24 hour  Intake          1155.84 ml  Output             1650 ml  Net          -494.16 ml   Filed Weights   10/28/16 2304 11/01/16 0320  Weight: 109.2 kg (240 lb 11.2 oz) 109.2 kg (240 lb 11.9 oz)    Examination:  General exam: Appears calm and comfortable  Respiratory system: Respiratory effort normal. Cardiovascular system: IRRR. No JVD, rubs, gallops or clicks.  Gastrointestinal system: Abdomen is nondistended, soft and nontender. No organomegaly or masses felt. Normal bowel sounds heard. Central nervous system: Alert and oriented.  Data Reviewed: I have personally reviewed following labs and imaging studies  CBC:  Recent Labs Lab 10/29/16 0406 10/30/16 0438 10/31/16 0419 11/01/16 0406  WBC 21.2* 10.7* 9.0 9.1  HGB 13.7 13.1 12.4 12.6  HCT 42.1 39.9 38.1 39.1  MCV 91.5 88.9 88.4 90.1  PLT 308 251 242 [id number]   Basic Metabolic Panel:  Recent Labs Lab 10/29/16 0406 10/30/16 0438 10/31/16 0419 11/01/16 0406  NA 138 139 140 142  K 4.1 3.6 3.2* 4.1  CL 111 106 104 105  CO2 16* 22 26 29   GLUCOSE 116* 79 67 52*  BUN 100* 98* 90* 75*  CREATININE 4.33* 3.83* 3.17* 2.45*  CALCIUM 7.6* 7.9* 8.1* 8.5*  PHOS  --  6.8* 5.3*  --    Liver Function Tests:  Recent Labs Lab 10/29/16 0406  10/30/16 0438 10/31/16 0419  AST 23  --   --   ALT 13*  --   --   ALKPHOS 76  --   --   BILITOT 0.6  --   --   PROT 4.1*  --   --   ALBUMIN 1.6* 1.5* 1.5*   Coagulation Profile:  Recent Labs Lab 10/29/16 0713 10/30/16 0438 10/31/16 0419 11/01/16 0406  INR 3.55 4.79* 4.07* 3.64   CBG:  Recent Labs Lab 10/31/16 1116 10/31/16 1720 10/31/16 2056 11/01/16 0848 11/01/16 [id number]  GLUCAP 137* 122* 167* 51* 62*   Urine analysis:    Component Value Date/Time   COLORURINE AMBER (A) 10/29/2016 0057   APPEARANCEUR CLOUDY (A) 10/29/2016 0057   LABSPEC 1.016 10/29/2016 0057   PHURINE 5.0 10/29/2016 0057   GLUCOSEU NEGATIVE 10/29/2016 0057   HGBUR LARGE (A) 10/29/2016 0057   BILIRUBINUR NEGATIVE 10/29/2016 0057   KETONESUR NEGATIVE 10/29/2016 0057   PROTEINUR 30 (A) 10/29/2016 0057   NITRITE NEGATIVE 10/29/2016 0057   LEUKOCYTESUR MODERATE (A) 10/29/2016 0057   Radiology Studies: No results found.  Scheduled Meds: . ampicillin (OMNIPEN) IV  1 g Intravenous Q12H  . insulin aspart  0-5 Units Subcutaneous QHS  . insulin aspart  0-9 Units Subcutaneous TID WC  . insulin glargine  30 Units Subcutaneous Daily  . levothyroxine  25 mcg Oral QAC breakfast  . metoprolol  12.5 mg Oral BID  . pravastatin  40 mg Oral Daily  . senna-docusate  1 tablet Oral BID  . sodium chloride flush  3 mL Intravenous Q12H  . vancomycin  500 mg Oral Q6H  . Warfarin - Pharmacist Dosing Inpatient   Does not apply q1800   Continuous Infusions: .  sodium bicarbonate 150 mEq in sterile water 1000 mL infusion 150 mEq (10/31/16 1900)     LOS: 4 days   Time spent: 20 minutes   Faye Ramsay, MD [facility] Pager [phone]  If 7PM-7AM, please contact night-coverage [email or web address] Password [facility] 11/01/2016, 10:43 AM

## 2016-11-01 NOTE — Progress Notes (Signed)
Patient transferred to 5W17 from 3S12. All belongings sent with patient. Elink and [place] notified. Report had previously been given to 5W nurse.   Joellen Jersey, RN.

## 2016-11-01 NOTE — Progress Notes (Signed)
ANTICOAGULATION CONSULT NOTE  Pharmacy Consult for Coumadin Indication: atrial fibrillation  No Known Allergies  Patient Measurements: Weight: 240 lb 11.9 oz (109.2 kg)  Vital Signs: Temp: 97.3 F (36.3 C) (12/12 0700) Temp Source: Oral (12/12 0700) BP: 109/61 (12/12 0700) Pulse Rate: 77 (12/12 0700)  Labs:  Recent Labs  10/30/16 0438 10/31/16 0419 11/01/16 0406  HGB 13.1 12.4 12.6  HCT 39.9 38.1 39.1  PLT 251 242 236  LABPROT 45.7* 40.5* 37.1*  INR 4.79* 4.07* 3.64  CREATININE 3.83* 3.17* 2.45*    CrCl cannot be calculated (Unknown ideal weight.).   Medical History: Past Medical History:  Diagnosis Date  . CHF (congestive heart failure) ([place])   . Chronic kidney disease   . Diabetes mellitus without complication ([place])   . Dysrhythmia   . Hypertension   . Hypothyroidism       PTA Coumadin dose: Coumadin 1 mg daily except 2 mg Wed/Sat per 08/2016 discharge summary   Assessment: 75 y.o. female [facility] from [facility] with ARF and CDiff colitis, h/o Afib, to continue Coumadin.   Admitted to [facility] 12/5 with supratheraputic INR of 9.5.  Received Vit K 5 mg orally. INR 4.6 12/6, 2.2 on 12/7, and 2.6 on 12/8.  Coumadin 2 mg daily restarted and was given 12/7 and 12/8.  INR has been SUPRAtherapeutic since that time and no doses have been ordered.  Today's INR remains elevated.  No bleeding or complications noted.  CBC stable.  Goal of Therapy:  INR 2-3 Monitor platelets by anticoagulation protocol: Yes   Plan:  No Coumadin tonight. Daily PT/INR.  [employer], Pharm.D., BCPS Clinical Pharmacist Pager [phone] 11/01/2016 2:40 PM

## 2016-11-01 NOTE — Progress Notes (Signed)
Hypoglycemic Event  CBG: 51  Treatment: 15 GM carbohydrate snack  Symptoms: None  Follow-up CBG:  CBG Result:62, pt ate lunch after this CBG was obtained and was not rechecked.   Possible Reasons for Event: Medication regimen: Pt receives scheduled lantus, dose may need to be adjusted.   Comments/MD notified: Dr. Doyle Askew notified. New orders received; daily lantus dose decreased.     Aaron Edelman, Lari Linson [place]

## 2016-11-01 NOTE — Progress Notes (Signed)
Admit: 10/28/2016 LOS: 4  59F AoCKD3 (BL 1.3-1.5) from hypovolemia (CDI) + ACEi + Hypotension  Subjective:  Further improved SCr, K and HCO3 ok Excellent UOP Still on NaHCO3 gtt Still with several episodes of diarrhea  12/11 0701 - 12/12 0700 In: 1330 [P.O.:480; I.V.:750; IV Piggyback:100] Out: 1800 [Urine:1800]  Filed Weights   10/28/16 2304 11/01/16 0320  Weight: 109.2 kg (240 lb 11.2 oz) 109.2 kg (240 lb 11.9 oz)    Scheduled Meds: . ampicillin (OMNIPEN) IV  1 g Intravenous Q12H  . insulin aspart  0-5 Units Subcutaneous QHS  . insulin aspart  0-9 Units Subcutaneous TID WC  . insulin glargine  30 Units Subcutaneous Daily  . levothyroxine  25 mcg Oral QAC breakfast  . metoprolol  12.5 mg Oral BID  . pravastatin  40 mg Oral Daily  . senna-docusate  1 tablet Oral BID  . sodium chloride flush  3 mL Intravenous Q12H  . vancomycin  500 mg Oral Q6H  . Warfarin - Pharmacist Dosing Inpatient   Does not apply q1800   Continuous Infusions: .  sodium bicarbonate 150 mEq in sterile water 1000 mL infusion 150 mEq (10/31/16 1900)   PRN Meds:.acetaminophen **OR** acetaminophen, albuterol, ondansetron **OR** ondansetron (ZOFRAN) IV, oxyCODONE  Current Labs: reviewed    Physical Exam:  Blood pressure 109/61, pulse 77, temperature 97.3 F (36.3 C), temperature source Oral, resp. rate 19, weight 109.2 kg (240 lb 11.9 oz), SpO2 92 %. IRIR, nl rate CTAB, diminishedi n bases No LEE AAO Nonfocal Obese, NAD  A 1. AoCKD3 (BL 1.3-1.5) 2/2 hypovolemia, ACEi use 2. Metabolic Acidosis; improved with resolving diarrhe and alkali therpay 3. CDI on PO Vanc 4. DM2 5. AFib on warfarin, digoxin 6. OSH Enterococcal UTI on ampicillin 7. R ankle fracture  P 1. Appears to be recovering 2. Cont IVFs until diarrhea further improved and good PO intake 3. Encourage PO intake 4. Will sign off for now.  Please call with any questions or concerns.  Pt does need follow up with nephrology and I will  arrange outpt followup   Pearson Grippe MD 11/01/2016, 8:11 AM   Recent Labs Lab 10/30/16 0438 10/31/16 0419 11/01/16 0406  NA 139 140 142  K 3.6 3.2* 4.1  CL 106 104 105  CO2 22 26 29   GLUCOSE 79 67 52*  BUN 98* 90* 75*  CREATININE 3.83* 3.17* 2.45*  CALCIUM 7.9* 8.1* 8.5*  PHOS 6.8* 5.3*  --     Recent Labs Lab 10/30/16 0438 10/31/16 0419 11/01/16 0406  WBC 10.7* 9.0 9.1  HGB 13.1 12.4 12.6  HCT 39.9 38.1 39.1  MCV 88.9 88.4 90.1  PLT 251 242 236

## 2016-11-01 NOTE — Care Management Note (Signed)
Case Management Note  Patient Details  Name: Danielle Woodard MRN: [id number] Date of Birth: Apr 12, 1941  Subjective/Objective:    Cdiff, ARF, per pt eval rec snf, CSW following for placement.                Action/Plan:   Expected Discharge Date:                  Expected Discharge Plan:  Skilled Nursing Facility  In-House Referral:  Clinical Social Work  Discharge planning Services  CM Consult  Post Acute Care Choice:    Choice offered to:     DME Arranged:    DME Agency:     HH Arranged:    [place] Agency:     Status of Service:  Completed, signed off  If discussed at [employer], dates discussed:    Additional Comments:  Zenon Mayo, RN 11/01/2016, 12:34 PM

## 2016-11-01 NOTE — Progress Notes (Signed)
Inpatient Diabetes Program Recommendations  AACE/ADA: New Consensus Statement on Inpatient Glycemic Control (2015)  Target Ranges:  Prepandial:   less than 140 mg/dL      Peak postprandial:   less than 180 mg/dL (1-2 hours)      Critically ill patients:  140 - 180 mg/dL   Lab Results  Component Value Date   GLUCAP 62 (L) 11/01/2016   HGBA1C 7.1 (H) 10/29/2016    Review of Glycemic Control Results for Danielle Woodard, Danielle Woodard (MRN [id number]) as of 11/01/2016 09:31  Ref. Range 10/31/2016 07:26 10/31/2016 11:16 10/31/2016 17:20 10/31/2016 20:56 11/01/2016 08:48 11/01/2016 09:22  Glucose-Capillary Latest Ref Range: 65 - 99 mg/dL 72 137 (H) 122 (H) 167 (H) 51 (L) 62 (L)   Diabetes history: DM2 Outpatient Diabetes medications: 70/30 30 units ac breakfast +30 units pm Current orders for Inpatient glycemic control: Lantus 30 units + Novolog correction 0-9 units tid+ 0-5 units hs  Inpatient Diabetes Program Recommendations:  Noted hypoglycemia. Please consider decrease in Lantus insulin to 20 units daily.  Thank you, Nani Gasser. Keora Eccleston, RN, MSN, CDE Inpatient Glycemic Control Team Team Pager [phone] (8am-5pm) 11/01/2016 9:34 AM

## 2016-11-02 DIAGNOSIS — G9341 Metabolic encephalopathy: Secondary | ICD-10-CM

## 2016-11-02 DIAGNOSIS — A0472 Enterocolitis due to Clostridium difficile, not specified as recurrent: Secondary | ICD-10-CM

## 2016-11-02 DIAGNOSIS — I509 Heart failure, unspecified: Secondary | ICD-10-CM

## 2016-11-02 DIAGNOSIS — N179 Acute kidney failure, unspecified: Secondary | ICD-10-CM

## 2016-11-02 DIAGNOSIS — I10 Essential (primary) hypertension: Secondary | ICD-10-CM

## 2016-11-02 LAB — RENAL FUNCTION PANEL
ALBUMIN: 1.7 g/dL — AB (ref 3.5–5.0)
Anion gap: 9 (ref 5–15)
BUN: 55 mg/dL — AB (ref 6–20)
CO2: 27 mmol/L (ref 22–32)
Calcium: 8.4 mg/dL — ABNORMAL LOW (ref 8.9–10.3)
Chloride: 107 mmol/L (ref 101–111)
Creatinine, Ser: 1.79 mg/dL — ABNORMAL HIGH (ref 0.44–1.00)
GFR calc Af Amer: 31 mL/min — ABNORMAL LOW (ref >60)
GFR calc non Af Amer: 26 mL/min — ABNORMAL LOW (ref >60)
GLUCOSE: 87 mg/dL (ref 65–99)
PHOSPHORUS: 3.5 mg/dL (ref 2.5–4.6)
POTASSIUM: 3.9 mmol/L (ref 3.5–5.1)
SODIUM: 143 mmol/L (ref 135–145)

## 2016-11-02 LAB — GLUCOSE, CAPILLARY
GLUCOSE-CAPILLARY: 123 mg/dL — AB (ref 65–99)
GLUCOSE-CAPILLARY: 75 mg/dL (ref 65–99)
GLUCOSE-CAPILLARY: 99 mg/dL (ref 65–99)
Glucose-Capillary: 72 mg/dL (ref 65–99)
Glucose-Capillary: 101 mg/dL — ABNORMAL HIGH (ref 65–99)
Glucose-Capillary: 74 mg/dL (ref 65–99)

## 2016-11-02 LAB — PROTIME-INR
INR: 3.68
PROTHROMBIN TIME: 37.5 s — AB (ref 11.4–15.2)

## 2016-11-02 LAB — CBC
HEMATOCRIT: 39.1 % (ref 36.0–46.0)
Hemoglobin: 12.5 g/dL (ref 12.0–15.0)
MCH: 28.9 pg (ref 26.0–34.0)
MCHC: 32 g/dL (ref 30.0–36.0)
MCV: 90.3 fL (ref 78.0–100.0)
Platelets: 198 10*3/uL (ref 150–400)
RBC: 4.33 MIL/uL (ref 3.87–5.11)
RDW: 16.1 % — AB (ref 11.5–15.5)
WBC: 8.1 10*3/uL (ref 4.0–10.5)

## 2016-11-02 NOTE — Progress Notes (Signed)
PROGRESS NOTE [facility]   Danielle Woodard   [id number] DOB: March 07, 1941  DOA: 10/28/2016 PCP: No primary care provider on file.   Brief Narrative:  75 y.o.femalewith a past medical history significant for Afib on warfarin, IDDM, HTN, CKD III baseline Cr 1.3, hypothyroidism, and CHF unknown EFwho was transferred to [facility] for AKI. The patient underwent surgical repair of a right ankle fracture 3 weeks ago, uneventful post-op course, discharged to SNF for rehab. Now in last 1-2 weeks, has had progressive watery diarrhea until three days PTA and was brought to [facility] ER for vomiting and new confusion. There, she was found to have WBC 26K, Cr 4.1 (baseline 1.3), Na 130, INR 9.5, C diff toxin positive, T 97.9, HR 86, BP 103/52, lactic acid 1.3. Was given Flagyl for Cdiff and fluids for AKI and admitted to [facility] for further management. She was transferred from [facility] due to acute kidney failure or some metabolic acidosis with hypotension.  Subjective: Patient seen and examined. Doing much better, diarrhea has improved. Denies abdominal pain and nausea. Tolerating diet well.   Assessment & Plan: Acute kidney failure highly secondary to volume depletion due to C. difficile colitis. Patient has improved with IV fluid, and bicarbonate. Creatinine down to 1.7 Nephrology no further recommendations they have signed off Repeat BMP in the morning  C. Difficile colitis Continue oral vancomycin total of 14 days Advance diet as tolerated  Enterococcus UTI - source from outside hospital [place] Was started on IV ampicillin likely to be transitioned to by mouth medication in the morning. Her complete course of 7 days  Metabolic encephalopathy - resolved Secondary to AKI, colitis and dehydration  Right ankle fracture Previously receiving physical therapy at SNF PT evaluation was done recommended more physical therapy.  Hypertension BP medications where on hold due  to hypotensive Home medications have been resumed at lower dose (metoprolol) Monitor BP and titrate medications as tolerated.  Diabetes mellitus type 2 On Lantus 20 units Continue SSI  DVT prophylaxis: Coumadin, SCD Code Status: DO NOT RESUSCITATE Family Communication: (None at bedside Disposition Plan: Likely to go to SNF tomorrow  Consultants:   Nephrology - [place] [facility]    Procedures:  None   Antimicrobials:  Ampicillin 12/09  Oral Vanc 12/09    Objective: Vitals:   11/01/16 1825 11/01/16 2122 11/02/16 0611 11/02/16 1445  BP: (!) 172/67 (!) 156/57 (!) 153/67 (!) 155/62  Pulse: 69 63 (!) 55 71  Resp: 17 18 18 17   Temp: 97.6 F (36.4 C) 97.7 F (36.5 C) 98.1 F (36.7 C) 97.5 F (36.4 C)  TempSrc: Oral Oral Oral Oral  SpO2: 96% 100% 100% 97%  Weight:   115 kg (253 lb 8 oz)   Height:        Intake/Output Summary (Last 24 hours) at 11/02/16 1655 Last data filed at 11/02/16 1212  Gross per 24 hour  Intake                0 ml  Output             2000 ml  Net            -2000 ml   Filed Weights   11/01/16 0320 11/01/16 1819 11/02/16 0611  Weight: 109.2 kg (240 lb 11.9 oz) 111.7 kg (246 lb 3.2 oz) 115 kg (253 lb 8 oz)    Examination:  General exam: Appears calm and comfortable  Respiratory system: Clear to auscultation. No wheezes,crackle or rhonchi Cardiovascular system:  S1 & S2 heard, RRR. No JVD, murmurs, rubs or gallops Gastrointestinal system: Abdomen is nondistended, soft and nontender. No organomegaly or masses felt. Normal bowel sounds heard. Central nervous system: Alert and oriented. Extremities: No pedal edema.     Data Reviewed: I have personally reviewed following labs and imaging studies  CBC:  Recent Labs Lab 10/29/16 0406 10/30/16 0438 10/31/16 0419 11/01/16 0406 11/02/16 0535  WBC 21.2* 10.7* 9.0 9.1 8.1  HGB 13.7 13.1 12.4 12.6 12.5  HCT 42.1 39.9 38.1 39.1 39.1  MCV 91.5 88.9 88.4 90.1 90.3  PLT 308 251 242 236 [id number]    Basic Metabolic Panel:  Recent Labs Lab 10/29/16 0406 10/30/16 0438 10/31/16 0419 11/01/16 0406 11/02/16 0535  NA 138 139 140 142 143  K 4.1 3.6 3.2* 4.1 3.9  CL 111 106 104 105 107  CO2 16* 22 26 29 27   GLUCOSE 116* 79 67 52* 87  BUN 100* 98* 90* 75* 55*  CREATININE 4.33* 3.83* 3.17* 2.45* 1.79*  CALCIUM 7.6* 7.9* 8.1* 8.5* 8.4*  PHOS  --  6.8* 5.3*  --  3.5   GFR: Estimated Creatinine Clearance: 32.7 mL/min (by C-G formula based on SCr of 1.79 mg/dL (H)). Liver Function Tests:  Recent Labs Lab 10/29/16 0406 10/30/16 0438 10/31/16 0419 11/02/16 0535  AST 23  --   --   --   ALT 13*  --   --   --   ALKPHOS 76  --   --   --   BILITOT 0.6  --   --   --   PROT 4.1*  --   --   --   ALBUMIN 1.6* 1.5* 1.5* 1.7*   No results for input(s): LIPASE, AMYLASE in the last 168 hours. No results for input(s): AMMONIA in the last 168 hours. Coagulation Profile:  Recent Labs Lab 10/29/16 0713 10/30/16 0438 10/31/16 0419 11/01/16 0406 11/02/16 0535  INR 3.55 4.79* 4.07* 3.64 3.68   Cardiac Enzymes:  Recent Labs Lab 10/29/16 1224  CKTOTAL 59   BNP (last 3 results) No results for input(s): PROBNP in the last 8760 hours. HbA1C: No results for input(s): HGBA1C in the last 72 hours. CBG:  Recent Labs Lab 11/01/16 2121 11/02/16 0028 11/02/16 0405 11/02/16 0809 11/02/16 1218  GLUCAP 98 99 75 72 101*   Lipid Profile: No results for input(s): CHOL, HDL, LDLCALC, TRIG, CHOLHDL, LDLDIRECT in the last 72 hours. Thyroid Function Tests: No results for input(s): TSH, T4TOTAL, FREET4, T3FREE, THYROIDAB in the last 72 hours. Anemia Panel: No results for input(s): VITAMINB12, FOLATE, FERRITIN, TIBC, IRON, RETICCTPCT in the last 72 hours. Sepsis Labs:  Recent Labs Lab 10/29/16 0713 10/29/16 0923  LATICACIDVEN 1.1 1.1    Recent Results (from the past 240 hour(s))  Culture, Urine     Status: None   Collection Time: 10/29/16 12:57 AM  Result Value Ref Range Status    Specimen Description URINE, RANDOM  Final   Special Requests NONE  Final   Culture NO GROWTH  Final   Report Status 10/30/2016 FINAL  Final  MRSA PCR Screening     Status: None   Collection Time: 10/29/16  6:00 AM  Result Value Ref Range Status   MRSA by PCR NEGATIVE NEGATIVE Final    Comment:        The GeneXpert MRSA Assay (FDA approved for NASAL specimens only), is one component of a comprehensive MRSA colonization surveillance program. It is not intended to diagnose MRSA infection nor  to guide or monitor treatment for MRSA infections.          Radiology Studies: No results found.    Scheduled Meds: . ampicillin (OMNIPEN) IV  1 g Intravenous Q12H  . insulin aspart  0-5 Units Subcutaneous QHS  . insulin aspart  0-9 Units Subcutaneous TID WC  . insulin glargine  20 Units Subcutaneous Daily  . levothyroxine  25 mcg Oral QAC breakfast  . metoprolol  12.5 mg Oral BID  . pravastatin  40 mg Oral Daily  . senna-docusate  1 tablet Oral BID  . sodium chloride flush  3 mL Intravenous Q12H  . vancomycin  500 mg Oral Q6H  . Warfarin - Pharmacist Dosing Inpatient   Does not apply q1800   Continuous Infusions:   LOS: 5 days    Chipper Oman, MD [facility] Pager [phone]  If 7PM-7AM, please contact night-coverage [email or web address] Password [email or web address] 11/02/2016, 4:55 PM

## 2016-11-02 NOTE — Progress Notes (Signed)
Physical Therapy Treatment Patient Details Name: Danielle Woodard MRN: [id number] DOB: 1941-10-13 Today's Date: 11/02/2016    History of Present Illness Danielle Woodard is a 75 y.o. female with a PMH significant for Afib on warfarin, IDDM, HTN, CKD III baseline Cr 1.3, hypothyroidism, and CHF unknown EF who was transferred to [facility] for AKI.    PT Comments    Pt performed increased mobility and progressed to +1 assistance from bed to chair with RW.  Pt motivated to stand and pleased with her progress.  Will plan on progression of gait training next session with close chair follow.  Pt continues to be appropriate for short term SNF stay to improve strength and functional mobility.    Follow Up Recommendations  SNF     Equipment Recommendations  None recommended by PT    Recommendations for Other Services       Precautions / Restrictions Precautions Precautions: Fall Required Braces or Orthoses: Other Brace/Splint Other Brace/Splint: CAM boot RLE Restrictions Weight Bearing Restrictions: Yes RLE Weight Bearing: Weight bearing as tolerated Other Position/Activity Restrictions: in CAM boot.    Mobility  Bed Mobility Overal bed mobility: Needs Assistance Bed Mobility: Supine to Sit     Supine to sit: Min assist;Mod assist     General bed mobility comments: Pt able to advance LEs to edge of bed, pt then required mod assist to elevate trunk into sitting and Mod assist to scoot R hip out to edge of bed.    Transfers Overall transfer level: Needs assistance Equipment used: Rolling walker (2 wheeled) Transfers: Sit to/from Stand Sit to Stand: Mod assist;From elevated surface         General transfer comment: Pt performed sit to stand from bed.  Pt able to stand x 40 secs then returned to seated position.  Pt then stood at [facility] and able to progress steps to chair from bed.    Ambulation/Gait Ambulation/Gait assistance: Mod assist Ambulation Distance (Feet):  (steps from  bed to chair.  ) Assistive device: Rolling walker (2 wheeled) Gait Pattern/deviations: Step-through pattern;Shuffle;Trunk flexed;Decreased stride length   Gait velocity interpretation: Below normal speed for age/gender General Gait Details: Cues for turning and backing and RW placement to maintain safety.    Stairs            Wheelchair Mobility    Modified Rankin (Stroke Patients Only)       Balance Overall balance assessment: Needs assistance   Sitting balance-Leahy Scale: Fair       Standing balance-Leahy Scale: Poor                      Cognition Arousal/Alertness: Awake/alert Behavior During Therapy: WFL for tasks assessed/performed Overall Cognitive Status: Within Functional Limits for tasks assessed                      Exercises      General Comments        Pertinent Vitals/Pain Pain Assessment: Faces Faces Pain Scale: Hurts a little bit Pain Location: during application of CAM boot.   Pain Descriptors / Indicators: Discomfort Pain Intervention(s): Repositioned    Home Living                      Prior Function            PT Goals (current goals can now be found in the care plan section) Acute Rehab PT Goals Patient Stated Goal: return  to rehab to get stronger Potential to Achieve Goals: Good Progress towards PT goals: Progressing toward goals    Frequency    Min 2X/week      PT Plan Current plan remains appropriate    Co-evaluation             End of Session Equipment Utilized During Treatment: Gait belt Activity Tolerance: Patient limited by fatigue Patient left: in chair;with call bell/phone within reach;with chair alarm set     Time: [id number] PT Time Calculation (min) (ACUTE ONLY): 30 min  Charges:  $Therapeutic Activity: 23-37 mins                    G Codes:      [name] November 20, 2016, 1:37 PM  Governor Rooks, PTA pager [phone]

## 2016-11-02 NOTE — Care Management Important Message (Signed)
Important Message  Patient Details  Name: Danielle Woodard MRN: [id number] Date of Birth: 1941-10-19   Medicare Important Message Given:  Yes    Carles Collet, RN 11/02/2016, 12:01 PM

## 2016-11-02 NOTE — Care Management Note (Signed)
Case Management Note  Patient Details  Name: Danielle Woodard MRN: [id number] Date of Birth: 08-19-1941  Subjective/Objective:                 Transferred from 3S, admitted from [facility], AKI +Cdiff. IV and PO abx.    Action/Plan:  Anticipate return to SNF when medically clear. CSW following.  Expected Discharge Date:                  Expected Discharge Plan:  [place] (From [facility])  In-House Referral:  Clinical Social Work  Discharge planning Services  CM Consult  Post Acute Care Choice:    Choice offered to:     DME Arranged:    DME Agency:     HH Arranged:    [place] Agency:     Status of Service:  Completed, signed off  If discussed at [employer], dates discussed:    Additional Comments:  Carles Collet, RN 11/02/2016, 12:01 PM

## 2016-11-02 NOTE — Progress Notes (Signed)
ANTICOAGULATION CONSULT NOTE  Pharmacy Consult for Coumadin Indication: atrial fibrillation  No Known Allergies  Patient Measurements: Height: 5\' 3"  (160 cm) Weight: 253 lb 8 oz (115 kg) IBW/kg (Calculated) : 52.4  Vital Signs: Temp: 97.5 F (36.4 C) (12/13 1445) Temp Source: Oral (12/13 1445) BP: 155/62 (12/13 1445) Pulse Rate: 71 (12/13 1445)  Labs:  Recent Labs  10/31/16 0419 11/01/16 0406 11/02/16 0535  HGB 12.4 12.6 12.5  HCT 38.1 39.1 39.1  PLT 242 236 198  LABPROT 40.5* 37.1* 37.5*  INR 4.07* 3.64 3.68  CREATININE 3.17* 2.45* 1.79*    Estimated Creatinine Clearance: 32.7 mL/min (by C-G formula based on SCr of 1.79 mg/dL (H)).   Medical History: Past Medical History:  Diagnosis Date  . CHF (congestive heart failure) ([place])   . Chronic kidney disease   . Diabetes mellitus without complication ([place])   . Dysrhythmia   . Hypertension   . Hypothyroidism       PTA Coumadin dose: Coumadin 1 mg daily except 2 mg Wed/Sat per 08/2016 discharge summary   Assessment: 75 y.o. female transferred from [facility] with ARF and CDiff colitis, h/o Afib, to continue Coumadin.   Admitted to [facility] 12/5 with supratheraputic INR of 9.5.  Received Vit K 5 mg orally. INR 4.6 12/6, 2.2 on 12/7, and 2.6 on 12/8.  Coumadin 2 mg daily restarted and was given 12/7 and 12/8.  INR has been SUPRAtherapeutic since that time and no doses have been ordered.  Today's INR= 3.68, remains elevated.  No bleeding or complications noted.  CBC stable.  Goal of Therapy:  INR 2-3 Monitor platelets by anticoagulation protocol: Yes   Plan:  No Coumadin tonight. Daily PT/INR.  Nicole Cella, RPh Clinical Pharmacist Pager: [phone] 11/02/2016 5:40 PM

## 2016-11-03 DIAGNOSIS — E038 Other specified hypothyroidism: Secondary | ICD-10-CM | POA: Diagnosis not present

## 2016-11-03 DIAGNOSIS — E039 Hypothyroidism, unspecified: Secondary | ICD-10-CM | POA: Diagnosis not present

## 2016-11-03 DIAGNOSIS — D518 Other vitamin B12 deficiency anemias: Secondary | ICD-10-CM | POA: Diagnosis not present

## 2016-11-03 DIAGNOSIS — G9341 Metabolic encephalopathy: Secondary | ICD-10-CM | POA: Diagnosis not present

## 2016-11-03 DIAGNOSIS — E1122 Type 2 diabetes mellitus with diabetic chronic kidney disease: Secondary | ICD-10-CM

## 2016-11-03 DIAGNOSIS — N183 Chronic kidney disease, stage 3 (moderate): Secondary | ICD-10-CM

## 2016-11-03 DIAGNOSIS — Z79899 Other long term (current) drug therapy: Secondary | ICD-10-CM | POA: Diagnosis not present

## 2016-11-03 DIAGNOSIS — N179 Acute kidney failure, unspecified: Secondary | ICD-10-CM | POA: Diagnosis not present

## 2016-11-03 DIAGNOSIS — A0472 Enterocolitis due to Clostridium difficile, not specified as recurrent: Secondary | ICD-10-CM | POA: Diagnosis not present

## 2016-11-03 DIAGNOSIS — E669 Obesity, unspecified: Secondary | ICD-10-CM | POA: Diagnosis not present

## 2016-11-03 DIAGNOSIS — R2689 Other abnormalities of gait and mobility: Secondary | ICD-10-CM | POA: Diagnosis not present

## 2016-11-03 DIAGNOSIS — T148XXS Other injury of unspecified body region, sequela: Secondary | ICD-10-CM | POA: Diagnosis not present

## 2016-11-03 DIAGNOSIS — S8361XD Sprain of the superior tibiofibular joint and ligament, right knee, subsequent encounter: Secondary | ICD-10-CM | POA: Diagnosis not present

## 2016-11-03 DIAGNOSIS — Z4789 Encounter for other orthopedic aftercare: Secondary | ICD-10-CM | POA: Diagnosis not present

## 2016-11-03 DIAGNOSIS — Z794 Long term (current) use of insulin: Secondary | ICD-10-CM | POA: Diagnosis not present

## 2016-11-03 DIAGNOSIS — S82899S Other fracture of unspecified lower leg, sequela: Secondary | ICD-10-CM | POA: Diagnosis not present

## 2016-11-03 DIAGNOSIS — I482 Chronic atrial fibrillation: Secondary | ICD-10-CM | POA: Diagnosis not present

## 2016-11-03 DIAGNOSIS — I509 Heart failure, unspecified: Secondary | ICD-10-CM | POA: Diagnosis not present

## 2016-11-03 DIAGNOSIS — E782 Mixed hyperlipidemia: Secondary | ICD-10-CM | POA: Diagnosis not present

## 2016-11-03 DIAGNOSIS — M6281 Muscle weakness (generalized): Secondary | ICD-10-CM | POA: Diagnosis not present

## 2016-11-03 DIAGNOSIS — Z741 Need for assistance with personal care: Secondary | ICD-10-CM | POA: Diagnosis not present

## 2016-11-03 DIAGNOSIS — E119 Type 2 diabetes mellitus without complications: Secondary | ICD-10-CM | POA: Diagnosis not present

## 2016-11-03 DIAGNOSIS — I10 Essential (primary) hypertension: Secondary | ICD-10-CM | POA: Diagnosis not present

## 2016-11-03 DIAGNOSIS — E109 Type 1 diabetes mellitus without complications: Secondary | ICD-10-CM | POA: Diagnosis not present

## 2016-11-03 DIAGNOSIS — N189 Chronic kidney disease, unspecified: Secondary | ICD-10-CM | POA: Diagnosis not present

## 2016-11-03 DIAGNOSIS — I4891 Unspecified atrial fibrillation: Secondary | ICD-10-CM | POA: Diagnosis not present

## 2016-11-03 DIAGNOSIS — I129 Hypertensive chronic kidney disease with stage 1 through stage 4 chronic kidney disease, or unspecified chronic kidney disease: Secondary | ICD-10-CM | POA: Diagnosis not present

## 2016-11-03 DIAGNOSIS — Z7901 Long term (current) use of anticoagulants: Secondary | ICD-10-CM | POA: Diagnosis not present

## 2016-11-03 LAB — GLUCOSE, CAPILLARY
GLUCOSE-CAPILLARY: 77 mg/dL (ref 65–99)
GLUCOSE-CAPILLARY: 101 mg/dL — AB (ref 65–99)
Glucose-Capillary: 82 mg/dL (ref 65–99)

## 2016-11-03 LAB — BASIC METABOLIC PANEL
ANION GAP: 6 (ref 5–15)
BUN: 41 mg/dL — ABNORMAL HIGH (ref 6–20)
CO2: 31 mmol/L (ref 22–32)
Calcium: 8.5 mg/dL — ABNORMAL LOW (ref 8.9–10.3)
Chloride: 105 mmol/L (ref 101–111)
Creatinine, Ser: 1.5 mg/dL — ABNORMAL HIGH (ref 0.44–1.00)
GFR calc Af Amer: 38 mL/min — ABNORMAL LOW (ref >60)
GFR, EST NON AFRICAN AMERICAN: 33 mL/min — AB (ref >60)
Glucose, Bld: 80 mg/dL (ref 65–99)
POTASSIUM: 3.5 mmol/L (ref 3.5–5.1)
SODIUM: 142 mmol/L (ref 135–145)

## 2016-11-03 LAB — PROTIME-INR
INR: 2.93
Prothrombin Time: 31.2 seconds — ABNORMAL HIGH (ref 11.4–15.2)

## 2016-11-03 MED ORDER — OXYCODONE HCL 5 MG PO TABS: 5.0000 mg | ORAL_TABLET | Freq: Four times a day (QID) | ORAL | 0 refills | Status: AC | PRN

## 2016-11-03 MED ORDER — AMOXICILLIN 500 MG PO TABS: 500.0000 mg | ORAL_TABLET | Freq: Two times a day (BID) | ORAL | 0 refills | Status: AC

## 2016-11-03 MED ORDER — TRAMADOL HCL 50 MG PO TABS: 50.0000 mg | ORAL_TABLET | Freq: Four times a day (QID) | ORAL | 0 refills | Status: AC | PRN

## 2016-11-03 MED ORDER — WARFARIN SODIUM 1 MG PO TABS
1.0000 mg | ORAL_TABLET | Freq: Once | ORAL | Status: AC
  Administered 2016-11-03: 1 mg via ORAL
  Filled 2016-11-03: qty 1

## 2016-11-03 MED ORDER — VANCOMYCIN 50 MG/ML ORAL SOLUTION: 500.0000 mg | Freq: Four times a day (QID) | ORAL | 0 refills | Status: AC

## 2016-11-03 MED ORDER — SODIUM CHLORIDE 0.9 % IV SOLN
1.0000 g | Freq: Three times a day (TID) | INTRAVENOUS | Status: DC
  Administered 2016-11-03: 1 g via INTRAVENOUS
  Filled 2016-11-03 (×2): qty 1000

## 2016-11-03 NOTE — Progress Notes (Signed)
Called report to [facility] for pt. Pt taken via PTAR for facility.

## 2016-11-03 NOTE — Discharge Instructions (Signed)

## 2016-11-03 NOTE — Progress Notes (Signed)
Patient will discharge to [facility] Anticipated discharge date: 12/14 Family notified: Geophysical data processor by Corey Harold- called at 2:40pm- estimated that they are an hour behind  [place] signing off.  Jorge Ny, LCSW Clinical Social Worker [phone]

## 2016-11-03 NOTE — Consult Note (Signed)
[facility] CM Primary Care Navigator  11/03/2016  Danielle Woodard 1941/02/12 [id number]  Met with patient and family at the bedside to identify possible discharge needs.  Patient's siblings report that she resides at [facility] skilled [facility] facility in [place] and her care needs had been provided at the nursing home. Patient reports that she used to see Dr. Nelda Bucks with [facility] as the primary care provider prior to SNF, but is now being seen by nursing home doctor (Dr Nyra Capes).   Plan for discharge is to go back to skilled nursing facility when medically stable but family states that patient's bed in [facility] is no longer available and will have to look for one that she can go to.   Will notify Inpatient CSW of concern voiced by patient and family.  For additional questions please contact:  Edwena Felty A. Edwina Grossberg, BSN, RN-BC [facility] PRIMARY CARE Navigator Cell: [phone]

## 2016-11-03 NOTE — Progress Notes (Signed)
[place] for Coumadin Indication: atrial fibrillation  No Known Allergies  Patient Measurements: Height: 5\' 3"  (160 cm) Weight: 250 lb 12.8 oz (113.8 kg) IBW/kg (Calculated) : 52.4  Vital Signs: Temp: 97.4 F (36.3 C) (12/14 0617) Temp Source: Oral (12/14 0617) BP: 158/64 (12/14 0617) Pulse Rate: 60 (12/14 0617)  Labs:  Recent Labs  11/01/16 0406 11/02/16 0535 11/03/16 0426  HGB 12.6 12.5  --   HCT 39.1 39.1  --   PLT 236 198  --   LABPROT 37.1* 37.5* 31.2*  INR 3.64 3.68 2.93  CREATININE 2.45* 1.79* 1.50*    Estimated Creatinine Clearance: 38.8 mL/min (by C-G formula based on SCr of 1.5 mg/dL (H)).   Medical History: Past Medical History:  Diagnosis Date  . CHF (congestive heart failure) ([place])   . Chronic kidney disease   . Diabetes mellitus without complication ([place])   . Dysrhythmia   . Hypertension   . Hypothyroidism      PTA Coumadin dose: Coumadin 1 mg daily except 2 mg Wed/Sat per 08/2016 discharge summary  Assessment: 75 y.o. female transferred from [facility] with ARF and CDiff colitis, h/o Afib, to continue Coumadin.  Admitted to [facility] 12/5 with supratheraputic INR of 9.5 and subsequently received Vit K 5 mg orally.   INR therapeutic this morning at 2.93 with last dose of warfarin received on 12/8.  CBC stable with no reported bleeding.   Goal of Therapy:  INR 2-3 Monitor platelets by anticoagulation protocol: Yes   Plan:  1. Resume coumadin at a dose of 1 mg x1 dose this evening  2. Daily INR and CBC  Vincenza Hews, PharmD, BCPS 11/03/2016, 8:48 AM Pager: [phone]

## 2016-11-03 NOTE — Clinical Social Work Placement (Signed)
   CLINICAL SOCIAL WORK PLACEMENT  NOTE  Date:  11/03/2016  Patient Details  Name: Danielle Woodard MRN: [id number] Date of Birth: 03/20/1941  Clinical Social Work is seeking post-discharge placement for this patient at the [place] level of care (*CSW will initial, date and re-position this form in  chart as items are completed):  Yes   Patient/family provided with [place] Work Department's list of facilities offering this level of care within the geographic area requested by the patient (or if unable, by the patient's family).  Yes   Patient/family informed of their freedom to choose among providers that offer the needed level of care, that participate in Medicare, Medicaid or managed care program needed by the patient, have an available bed and are willing to accept the patient.  Yes   Patient/family informed of [place]'s ownership interest in [facility] and [facility], as well as of the fact that they are under no obligation to receive care at these facilities.  PASRR submitted to EDS on       PASRR number received on       Existing PASRR number confirmed on 11/03/16     FL2 transmitted to all facilities in geographic area requested by pt/family on 11/03/16     FL2 transmitted to all facilities within larger geographic area on       Patient informed that his/her managed care company has contracts with or will negotiate with certain facilities, including the following:        Yes   Patient/family informed of bed offers received.  Patient chooses bed at [facility] and [place] recommends and patient chooses bed at      Patient to be transferred to [facility] on 11/03/16.  Patient to be transferred to facility by ptar     Patient family notified on 11/03/16 of transfer.  Name of family member notified:  Peggy     PHYSICIAN Please sign FL2, Please sign DNR     Additional Comment:     _______________________________________________ Jorge Ny, LCSW 11/03/2016, 3:09 PM

## 2016-11-03 NOTE — Discharge Summary (Signed)
Physician Discharge Summary  Danielle Woodard  [id number]  DOB: December 28, 1940  DOA: 10/28/2016 PCP: No primary care provider on file.  Admit date: 10/28/2016 Discharge date: 11/03/2016  Admitted From: SNF  Disposition:  SNF  Recommendations for Outpatient Follow-up:  1. Follow up with PCP in 1-2 weeks 2. Please obtain BMP/CBC in one week  Discharge Condition: Fair  CODE STATUS: Full  Diet recommendation: Heart Healthy / Carb Modified   Brief/Interim Summary: 75 y.o.femalewith a past medical history significant for Afib on warfarin, IDDM, HTN, CKD III baseline Cr 1.3, hypothyroidism, and CHF unknown EFwho was transferred to [facility] for AKI. The patient underwent surgical repair of a right ankle fracture 3 weeks ago, uneventful post-op course, discharged to SNF for rehab. Now in last 1-2 weeks, has had progressive watery diarrhea until three days PTA and was brought to [facility] ER for vomiting and new confusion. There, she was found to have WBC 26K, Cr 4.1 (baseline 1.3), Na 130, INR 9.5, C diff toxin positive, T 97.9, HR 86, BP 103/52, lactic acid 1.3. Was given Flagyl for Cdiff and fluids for AKI and admitted to [facility] for further management. Subesequently was transferred from [facility] to [facility] due to acute kidney failure and metabolic acidosis with hypotension. Patient was treated with bicarb, IVF and antibiotics for Cdiff.   Subjective: Patient seen and examined this morning. She report that diarrhea has improved significantly. Now with only 1 episode of bowel movements an day, stools with better form. Denies abdominal pain, nausea, chest pain and SOB.   Discharge Diagnoses:  Acute kidney failure secondary to volume depletion due to C. difficile colitis. Patient has improved with IV fluid, and bicarbonate. - Cr back to baseline  Repeat BMP in 1 week  Encourage oral hydration  Follow up with PCP   C. Difficile colitis Continue oral vancomycin total of 14  days  Enterococcus UTI - source from outside hospital [facility] To complete 7 days of abx Amoxil for 2 more days   Metabolic encephalopathy - resolved Secondary to AKI, colitis and dehydration  Right ankle fracture Previously receiving physical therapy at SNF PT evaluation was done recommended more physical therapy at SNF  Hypertension - stable  BP medications where on hold due to hypotensive Resume home medications   Diabetes mellitus type 2 Continue home meds  Monitor fingerstick goal < 140 fasting    Discharge Instructions  Discharge Instructions    Call MD for:  difficulty breathing, headache or visual disturbances    Complete by:  As directed    Call MD for:  extreme fatigue    Complete by:  As directed    Call MD for:  hives    Complete by:  As directed    Call MD for:  persistant dizziness or light-headedness    Complete by:  As directed    Call MD for:  persistant nausea and vomiting    Complete by:  As directed    Call MD for:  redness, tenderness, or signs of infection (pain, swelling, redness, odor or green/yellow discharge around incision site)    Complete by:  As directed    Call MD for:  severe uncontrolled pain    Complete by:  As directed    Call MD for:  temperature >100.4    Complete by:  As directed    Diet - low sodium heart healthy    Complete by:  As directed    Increase activity slowly    Complete  by:  As directed        Medication List    STOP taking these medications   ciprofloxacin 250 MG tablet Commonly known as:  CIPRO     TAKE these medications   acetaminophen 500 MG tablet Commonly known as:  TYLENOL Take 500 mg by mouth 3 (three) times daily as needed (pain).   allopurinol 300 MG tablet Commonly known as:  ZYLOPRIM Take 300 mg by mouth daily.   amoxicillin 500 MG tablet Commonly known as:  AMOXIL Take 1 tablet (500 mg total) by mouth 2 (two) times daily.   CALTRATE 600+D PO Take 1 tablet by mouth daily.    cholecalciferol 1000 units tablet Commonly known as:  VITAMIN D Take 1,000 Units by mouth every other day.   digoxin 0.125 MG tablet Commonly known as:  LANOXIN Take 0.125 mg by mouth daily.   fenofibrate 145 MG tablet Commonly known as:  TRICOR Take 145 mg by mouth at bedtime.   Fish Oil 1000 MG Caps Take 2,000 mg by mouth at bedtime.   furosemide 40 MG tablet Commonly known as:  LASIX Take 40 mg by mouth daily.   gabapentin 100 MG capsule Commonly known as:  NEURONTIN Take 100 mg by mouth 3 (three) times daily. 6am, 2pm, 10pm   insulin aspart protamine- aspart (70-30) 100 UNIT/ML injection Commonly known as:  NOVOLOG MIX 70/30 Inject 30 Units into the skin See admin instructions. Inject 30 units subcutaneously before breakfast and at bedtime   levothyroxine 25 MCG tablet Commonly known as:  SYNTHROID, LEVOTHROID Take 25 mcg by mouth daily at 6 (six) AM.   lisinopril 40 MG tablet Commonly known as:  PRINIVIL,ZESTRIL Take 40 mg by mouth at bedtime.   metoprolol 50 MG tablet Commonly known as:  LOPRESSOR Take 50 mg by mouth 2 (two) times daily.   oxyCODONE 5 MG immediate release tablet Commonly known as:  Oxy IR/ROXICODONE Take 5 mg by mouth every 4 (four) hours as needed (pain).   pravastatin 40 MG tablet Commonly known as:  PRAVACHOL Take 40 mg by mouth at bedtime.   PROAIR HFA 108 (90 Base) MCG/ACT inhaler Generic drug:  albuterol Inhale 2 puffs into the lungs 4 (four) times daily as needed for wheezing or shortness of breath.   SENNA-S 8.6-50 MG tablet Generic drug:  senna-docusate Take 1 tablet by mouth 2 (two) times daily.   traMADol 50 MG tablet Commonly known as:  ULTRAM Take 50 mg by mouth every 6 (six) hours as needed (pain).   vancomycin 50 mg/mL oral solution Commonly known as:  VANCOCIN Take 10 mLs (500 mg total) by mouth every 6 (six) hours.   warfarin 1 MG tablet Commonly known as:  COUMADIN Take 1 mg by mouth See admin instructions.  Take 1 tablet (1 mg) by mouth every other day at 5pm (alternating with 2 mg tablets)   warfarin 2 MG tablet Commonly known as:  COUMADIN Take 2 mg by mouth See admin instructions. Take 1 tablet (2 mg) by mouth every other day at 5pm (alternating with 1 mg tablet)       No Known Allergies  Consultations:  Renal - [facility]    Procedures/Studies: US Renal  Result Date: 10/29/2016 CLINICAL DATA:  Acute renal failure EXAM: RENAL / URINARY TRACT ULTRASOUND COMPLETE COMPARISON:  None available FINDINGS: Right Kidney: Length: 15.1 cm. Very difficult to visualize sonographically. No gross hydronephrosis. Hypoechoic right renal cyst noted, largest measures 7.1 x 7.4 x  8.2 cm. Second right renal cyst measures 4.0 x 5.2 x 3.8 cm. Left Kidney: Length: 11.8 cm. Scattered hypoechoic exophytic left renal cysts (3 visualized). Largest in the upper pole measures 3.8 cm. Mid and lower pole cysts measure 1.7 and 1.6 cm respectively. No hydronephrosis. Normal echogenicity. Bladder: Collapsed.  Not visualized. Additional findings: Left pleural effusion demonstrated. Hepatic calcification noted along the liver hilum measuring 1.7 cm suspicious for hepatic granuloma. No upper abdominal free fluid. IMPRESSION: Limited visualization of the right kidney secondary to body habitus. No gross hydronephrosis or acute process Bilateral renal cysts Completely collapsed bladder Left pleural effusion Electronically Signed   By: Jerilynn Mages.  Shick M.D.   On: 10/29/2016 09:17     Discharge Exam: Vitals:   11/02/16 2143 11/03/16 0617  BP: (!) 146/62 (!) 158/64  Pulse: 68 60  Resp: 18 18  Temp: 98.3 F (36.8 C) 97.4 F (36.3 C)   Vitals:   11/02/16 1445 11/02/16 2143 11/03/16 0352 11/03/16 0617  BP: (!) 155/62 (!) 146/62  (!) 158/64  Pulse: 71 68  60  Resp: 17 18  18   Temp: 97.5 F (36.4 C) 98.3 F (36.8 C)  97.4 F (36.3 C)  TempSrc: Oral   Oral  SpO2: 97% 98%  95%  Weight:   113.8 kg (250 lb 12.8 oz)    Height:        General: Pt is alert, awake, not in acute distress Cardiovascular: RRR, S1/S2 +, no rubs, no gallops Respiratory: CTA bilaterally, no wheezing, no rhonchi Abdominal: Obese soft, NT, ND, bowel sounds + Extremities: no edema, no cyanosis   The results of significant diagnostics from this hospitalization (including imaging, microbiology, ancillary and laboratory) are listed below for reference.     Microbiology: Recent Results (from the past 240 hour(s))  Culture, Urine     Status: None   Collection Time: 10/29/16 12:57 AM  Result Value Ref Range Status   Specimen Description URINE, RANDOM  Final   Special Requests NONE  Final   Culture NO GROWTH  Final   Report Status 10/30/2016 FINAL  Final  MRSA PCR Screening     Status: None   Collection Time: 10/29/16  6:00 AM  Result Value Ref Range Status   MRSA by PCR NEGATIVE NEGATIVE Final    Comment:        The GeneXpert MRSA Assay (FDA approved for NASAL specimens only), is one component of a comprehensive MRSA colonization surveillance program. It is not intended to diagnose MRSA infection nor to guide or monitor treatment for MRSA infections.      Labs: BNP (last 3 results) No results for input(s): BNP in the last 8760 hours. Basic Metabolic Panel:  Recent Labs Lab 10/30/16 0438 10/31/16 0419 11/01/16 0406 11/02/16 0535 11/03/16 0426  NA 139 140 142 143 142  K 3.6 3.2* 4.1 3.9 3.5  CL 106 104 105 107 105  CO2 22 26 29 27 31   GLUCOSE 79 67 52* 87 80  BUN 98* 90* 75* 55* 41*  CREATININE 3.83* 3.17* 2.45* 1.79* 1.50*  CALCIUM 7.9* 8.1* 8.5* 8.4* 8.5*  PHOS 6.8* 5.3*  --  3.5  --    Liver Function Tests:  Recent Labs Lab 10/29/16 0406 10/30/16 0438 10/31/16 0419 11/02/16 0535  AST 23  --   --   --   ALT 13*  --   --   --   ALKPHOS 76  --   --   --   BILITOT 0.6  --   --   --  PROT 4.1*  --   --   --   ALBUMIN 1.6* 1.5* 1.5* 1.7*   No results for input(s): LIPASE, AMYLASE in the last  168 hours. No results for input(s): AMMONIA in the last 168 hours. CBC:  Recent Labs Lab 10/29/16 0406 10/30/16 0438 10/31/16 0419 11/01/16 0406 11/02/16 0535  WBC 21.2* 10.7* 9.0 9.1 8.1  HGB 13.7 13.1 12.4 12.6 12.5  HCT 42.1 39.9 38.1 39.1 39.1  MCV 91.5 88.9 88.4 90.1 90.3  PLT 308 251 242 236 198   Cardiac Enzymes:  Recent Labs Lab 10/29/16 1224  CKTOTAL 59   BNP: Invalid input(s): POCBNP CBG:  Recent Labs Lab 11/02/16 1218 11/02/16 1816 11/02/16 2142 11/03/16 0804 11/03/16 1258  GLUCAP 101* 74 123* 77 82   D-Dimer No results for input(s): DDIMER in the last 72 hours. Hgb A1c No results for input(s): HGBA1C in the last 72 hours. Lipid Profile No results for input(s): CHOL, HDL, LDLCALC, TRIG, CHOLHDL, LDLDIRECT in the last 72 hours. Thyroid function studies No results for input(s): TSH, T4TOTAL, T3FREE, THYROIDAB in the last 72 hours.  Invalid input(s): FREET3 Anemia work up No results for input(s): VITAMINB12, FOLATE, FERRITIN, TIBC, IRON, RETICCTPCT in the last 72 hours. Urinalysis    Component Value Date/Time   COLORURINE AMBER (A) 10/29/2016 0057   APPEARANCEUR CLOUDY (A) 10/29/2016 0057   LABSPEC 1.016 10/29/2016 0057   PHURINE 5.0 10/29/2016 0057   GLUCOSEU NEGATIVE 10/29/2016 0057   HGBUR LARGE (A) 10/29/2016 0057   BILIRUBINUR NEGATIVE 10/29/2016 0057   KETONESUR NEGATIVE 10/29/2016 0057   PROTEINUR 30 (A) 10/29/2016 0057   NITRITE NEGATIVE 10/29/2016 0057   LEUKOCYTESUR MODERATE (A) 10/29/2016 0057   Sepsis Labs Invalid input(s): PROCALCITONIN,  WBC,  LACTICIDVEN Microbiology Recent Results (from the past 240 hour(s))  Culture, Urine     Status: None   Collection Time: 10/29/16 12:57 AM  Result Value Ref Range Status   Specimen Description URINE, RANDOM  Final   Special Requests NONE  Final   Culture NO GROWTH  Final   Report Status 10/30/2016 FINAL  Final  MRSA PCR Screening     Status: None   Collection Time: 10/29/16  6:00  AM  Result Value Ref Range Status   MRSA by PCR NEGATIVE NEGATIVE Final    Comment:        The GeneXpert MRSA Assay (FDA approved for NASAL specimens only), is one component of a comprehensive MRSA colonization surveillance program. It is not intended to diagnose MRSA infection nor to guide or monitor treatment for MRSA infections.     Time coordinating discharge: Over 30 minutes  SIGNED:  Chipper Oman, MD  [facility] 11/03/2016, 2:22 PM Pager   If 7PM-7AM, please contact night-coverage [email or web address] Password [email or web address]

## 2016-11-10 DIAGNOSIS — E038 Other specified hypothyroidism: Secondary | ICD-10-CM | POA: Diagnosis not present

## 2016-11-10 DIAGNOSIS — Z79899 Other long term (current) drug therapy: Secondary | ICD-10-CM | POA: Diagnosis not present

## 2016-11-10 DIAGNOSIS — E119 Type 2 diabetes mellitus without complications: Secondary | ICD-10-CM | POA: Diagnosis not present

## 2016-11-10 DIAGNOSIS — E782 Mixed hyperlipidemia: Secondary | ICD-10-CM | POA: Diagnosis not present

## 2016-11-10 DIAGNOSIS — D518 Other vitamin B12 deficiency anemias: Secondary | ICD-10-CM | POA: Diagnosis not present

## 2016-11-10 DIAGNOSIS — Z7901 Long term (current) use of anticoagulants: Secondary | ICD-10-CM | POA: Diagnosis not present

## 2016-11-16 DIAGNOSIS — S8361XD Sprain of the superior tibiofibular joint and ligament, right knee, subsequent encounter: Secondary | ICD-10-CM | POA: Diagnosis not present

## 2016-11-16 DIAGNOSIS — S82899S Other fracture of unspecified lower leg, sequela: Secondary | ICD-10-CM | POA: Diagnosis not present

## 2016-11-16 DIAGNOSIS — Z7901 Long term (current) use of anticoagulants: Secondary | ICD-10-CM | POA: Diagnosis not present

## 2016-11-16 DIAGNOSIS — I129 Hypertensive chronic kidney disease with stage 1 through stage 4 chronic kidney disease, or unspecified chronic kidney disease: Secondary | ICD-10-CM | POA: Diagnosis not present

## 2016-11-16 DIAGNOSIS — E109 Type 1 diabetes mellitus without complications: Secondary | ICD-10-CM | POA: Diagnosis not present

## 2016-11-16 DIAGNOSIS — N179 Acute kidney failure, unspecified: Secondary | ICD-10-CM | POA: Diagnosis not present

## 2016-11-20 DIAGNOSIS — Z7901 Long term (current) use of anticoagulants: Secondary | ICD-10-CM | POA: Diagnosis not present

## 2016-11-21 DIAGNOSIS — N179 Acute kidney failure, unspecified: Secondary | ICD-10-CM | POA: Diagnosis not present

## 2016-11-21 DIAGNOSIS — E039 Hypothyroidism, unspecified: Secondary | ICD-10-CM | POA: Diagnosis not present

## 2016-11-21 DIAGNOSIS — I4891 Unspecified atrial fibrillation: Secondary | ICD-10-CM | POA: Diagnosis not present

## 2016-11-21 DIAGNOSIS — E109 Type 1 diabetes mellitus without complications: Secondary | ICD-10-CM | POA: Diagnosis not present

## 2016-11-22 DIAGNOSIS — E119 Type 2 diabetes mellitus without complications: Secondary | ICD-10-CM | POA: Diagnosis not present

## 2016-11-22 DIAGNOSIS — D518 Other vitamin B12 deficiency anemias: Secondary | ICD-10-CM | POA: Diagnosis not present

## 2016-11-22 DIAGNOSIS — E782 Mixed hyperlipidemia: Secondary | ICD-10-CM | POA: Diagnosis not present

## 2016-11-22 DIAGNOSIS — E038 Other specified hypothyroidism: Secondary | ICD-10-CM | POA: Diagnosis not present

## 2016-11-22 DIAGNOSIS — Z79899 Other long term (current) drug therapy: Secondary | ICD-10-CM | POA: Diagnosis not present

## 2016-11-23 DIAGNOSIS — Z7901 Long term (current) use of anticoagulants: Secondary | ICD-10-CM | POA: Diagnosis not present

## 2016-11-25 DIAGNOSIS — Z79899 Other long term (current) drug therapy: Secondary | ICD-10-CM | POA: Diagnosis not present

## 2016-11-25 DIAGNOSIS — Z7901 Long term (current) use of anticoagulants: Secondary | ICD-10-CM | POA: Diagnosis not present

## 2016-11-28 DIAGNOSIS — Z7901 Long term (current) use of anticoagulants: Secondary | ICD-10-CM | POA: Diagnosis not present

## 2016-11-30 DIAGNOSIS — I4891 Unspecified atrial fibrillation: Secondary | ICD-10-CM | POA: Diagnosis not present

## 2016-11-30 DIAGNOSIS — R262 Difficulty in walking, not elsewhere classified: Secondary | ICD-10-CM | POA: Diagnosis not present

## 2016-11-30 DIAGNOSIS — I1310 Hypertensive heart and chronic kidney disease without heart failure, with stage 1 through stage 4 chronic kidney disease, or unspecified chronic kidney disease: Secondary | ICD-10-CM | POA: Diagnosis not present

## 2016-11-30 DIAGNOSIS — M810 Age-related osteoporosis without current pathological fracture: Secondary | ICD-10-CM | POA: Diagnosis not present

## 2016-12-09 DIAGNOSIS — R05 Cough: Secondary | ICD-10-CM | POA: Diagnosis not present

## 2016-12-10 DIAGNOSIS — E1140 Type 2 diabetes mellitus with diabetic neuropathy, unspecified: Secondary | ICD-10-CM | POA: Diagnosis not present

## 2016-12-10 DIAGNOSIS — R0602 Shortness of breath: Secondary | ICD-10-CM | POA: Diagnosis not present

## 2016-12-10 DIAGNOSIS — R278 Other lack of coordination: Secondary | ICD-10-CM | POA: Diagnosis not present

## 2016-12-10 DIAGNOSIS — Z87891 Personal history of nicotine dependence: Secondary | ICD-10-CM | POA: Diagnosis not present

## 2016-12-10 DIAGNOSIS — Z66 Do not resuscitate: Secondary | ICD-10-CM | POA: Diagnosis present

## 2016-12-10 DIAGNOSIS — M1990 Unspecified osteoarthritis, unspecified site: Secondary | ICD-10-CM | POA: Diagnosis present

## 2016-12-10 DIAGNOSIS — R531 Weakness: Secondary | ICD-10-CM | POA: Diagnosis not present

## 2016-12-10 DIAGNOSIS — L8992 Pressure ulcer of unspecified site, stage 2: Secondary | ICD-10-CM | POA: Diagnosis present

## 2016-12-10 DIAGNOSIS — L8990 Pressure ulcer of unspecified site, unspecified stage: Secondary | ICD-10-CM | POA: Diagnosis not present

## 2016-12-10 DIAGNOSIS — R001 Bradycardia, unspecified: Secondary | ICD-10-CM | POA: Diagnosis present

## 2016-12-10 DIAGNOSIS — I130 Hypertensive heart and chronic kidney disease with heart failure and stage 1 through stage 4 chronic kidney disease, or unspecified chronic kidney disease: Secondary | ICD-10-CM | POA: Diagnosis present

## 2016-12-10 DIAGNOSIS — I509 Heart failure, unspecified: Secondary | ICD-10-CM | POA: Diagnosis not present

## 2016-12-10 DIAGNOSIS — R040 Epistaxis: Secondary | ICD-10-CM | POA: Diagnosis not present

## 2016-12-10 DIAGNOSIS — J449 Chronic obstructive pulmonary disease, unspecified: Secondary | ICD-10-CM | POA: Diagnosis present

## 2016-12-10 DIAGNOSIS — E872 Acidosis: Secondary | ICD-10-CM | POA: Diagnosis not present

## 2016-12-10 DIAGNOSIS — Z794 Long term (current) use of insulin: Secondary | ICD-10-CM | POA: Diagnosis not present

## 2016-12-10 DIAGNOSIS — G8929 Other chronic pain: Secondary | ICD-10-CM | POA: Diagnosis present

## 2016-12-10 DIAGNOSIS — R791 Abnormal coagulation profile: Secondary | ICD-10-CM | POA: Diagnosis not present

## 2016-12-10 DIAGNOSIS — Z79899 Other long term (current) drug therapy: Secondary | ICD-10-CM | POA: Diagnosis not present

## 2016-12-10 DIAGNOSIS — Z791 Long term (current) use of non-steroidal anti-inflammatories (NSAID): Secondary | ICD-10-CM | POA: Diagnosis not present

## 2016-12-10 DIAGNOSIS — G894 Chronic pain syndrome: Secondary | ICD-10-CM | POA: Diagnosis not present

## 2016-12-10 DIAGNOSIS — R2681 Unsteadiness on feet: Secondary | ICD-10-CM | POA: Diagnosis not present

## 2016-12-10 DIAGNOSIS — I110 Hypertensive heart disease with heart failure: Secondary | ICD-10-CM | POA: Diagnosis not present

## 2016-12-10 DIAGNOSIS — D62 Acute posthemorrhagic anemia: Secondary | ICD-10-CM | POA: Diagnosis not present

## 2016-12-10 DIAGNOSIS — R2689 Other abnormalities of gait and mobility: Secondary | ICD-10-CM | POA: Diagnosis not present

## 2016-12-10 DIAGNOSIS — Z7401 Bed confinement status: Secondary | ICD-10-CM | POA: Diagnosis not present

## 2016-12-10 DIAGNOSIS — J9611 Chronic respiratory failure with hypoxia: Secondary | ICD-10-CM | POA: Diagnosis not present

## 2016-12-10 DIAGNOSIS — M6281 Muscle weakness (generalized): Secondary | ICD-10-CM | POA: Diagnosis not present

## 2016-12-10 DIAGNOSIS — N183 Chronic kidney disease, stage 3 (moderate): Secondary | ICD-10-CM | POA: Diagnosis not present

## 2016-12-10 DIAGNOSIS — E119 Type 2 diabetes mellitus without complications: Secondary | ICD-10-CM | POA: Diagnosis not present

## 2016-12-10 DIAGNOSIS — J9601 Acute respiratory failure with hypoxia: Secondary | ICD-10-CM | POA: Diagnosis not present

## 2016-12-10 DIAGNOSIS — J9612 Chronic respiratory failure with hypercapnia: Secondary | ICD-10-CM | POA: Diagnosis present

## 2016-12-10 DIAGNOSIS — E11649 Type 2 diabetes mellitus with hypoglycemia without coma: Secondary | ICD-10-CM | POA: Diagnosis not present

## 2016-12-10 DIAGNOSIS — I482 Chronic atrial fibrillation: Secondary | ICD-10-CM | POA: Diagnosis present

## 2016-12-10 DIAGNOSIS — E1122 Type 2 diabetes mellitus with diabetic chronic kidney disease: Secondary | ICD-10-CM | POA: Diagnosis not present

## 2016-12-10 DIAGNOSIS — Z9981 Dependence on supplemental oxygen: Secondary | ICD-10-CM | POA: Diagnosis not present

## 2016-12-10 DIAGNOSIS — Z7901 Long term (current) use of anticoagulants: Secondary | ICD-10-CM | POA: Diagnosis not present

## 2016-12-11 DIAGNOSIS — Z7901 Long term (current) use of anticoagulants: Secondary | ICD-10-CM

## 2016-12-11 DIAGNOSIS — R040 Epistaxis: Secondary | ICD-10-CM

## 2016-12-11 DIAGNOSIS — D62 Acute posthemorrhagic anemia: Secondary | ICD-10-CM

## 2016-12-11 DIAGNOSIS — J9611 Chronic respiratory failure with hypoxia: Secondary | ICD-10-CM

## 2016-12-11 DIAGNOSIS — N183 Chronic kidney disease, stage 3 (moderate): Secondary | ICD-10-CM

## 2016-12-11 DIAGNOSIS — R791 Abnormal coagulation profile: Secondary | ICD-10-CM

## 2016-12-11 DIAGNOSIS — L8990 Pressure ulcer of unspecified site, unspecified stage: Secondary | ICD-10-CM

## 2016-12-13 DIAGNOSIS — M1990 Unspecified osteoarthritis, unspecified site: Secondary | ICD-10-CM | POA: Diagnosis not present

## 2016-12-13 DIAGNOSIS — N189 Chronic kidney disease, unspecified: Secondary | ICD-10-CM | POA: Diagnosis not present

## 2016-12-13 DIAGNOSIS — B351 Tinea unguium: Secondary | ICD-10-CM | POA: Diagnosis not present

## 2016-12-13 DIAGNOSIS — R509 Fever, unspecified: Secondary | ICD-10-CM | POA: Diagnosis not present

## 2016-12-13 DIAGNOSIS — M79675 Pain in left toe(s): Secondary | ICD-10-CM | POA: Diagnosis not present

## 2016-12-13 DIAGNOSIS — N183 Chronic kidney disease, stage 3 (moderate): Secondary | ICD-10-CM | POA: Diagnosis not present

## 2016-12-13 DIAGNOSIS — L8990 Pressure ulcer of unspecified site, unspecified stage: Secondary | ICD-10-CM | POA: Diagnosis not present

## 2016-12-13 DIAGNOSIS — M6281 Muscle weakness (generalized): Secondary | ICD-10-CM | POA: Diagnosis not present

## 2016-12-13 DIAGNOSIS — R791 Abnormal coagulation profile: Secondary | ICD-10-CM | POA: Diagnosis not present

## 2016-12-13 DIAGNOSIS — R2681 Unsteadiness on feet: Secondary | ICD-10-CM | POA: Diagnosis not present

## 2016-12-13 DIAGNOSIS — R799 Abnormal finding of blood chemistry, unspecified: Secondary | ICD-10-CM | POA: Diagnosis not present

## 2016-12-13 DIAGNOSIS — N179 Acute kidney failure, unspecified: Secondary | ICD-10-CM | POA: Diagnosis not present

## 2016-12-13 DIAGNOSIS — D62 Acute posthemorrhagic anemia: Secondary | ICD-10-CM | POA: Diagnosis not present

## 2016-12-13 DIAGNOSIS — I129 Hypertensive chronic kidney disease with stage 1 through stage 4 chronic kidney disease, or unspecified chronic kidney disease: Secondary | ICD-10-CM | POA: Diagnosis not present

## 2016-12-13 DIAGNOSIS — R040 Epistaxis: Secondary | ICD-10-CM | POA: Diagnosis not present

## 2016-12-13 DIAGNOSIS — R4182 Altered mental status, unspecified: Secondary | ICD-10-CM | POA: Diagnosis not present

## 2016-12-13 DIAGNOSIS — M79674 Pain in right toe(s): Secondary | ICD-10-CM | POA: Diagnosis not present

## 2016-12-13 DIAGNOSIS — N289 Disorder of kidney and ureter, unspecified: Secondary | ICD-10-CM | POA: Diagnosis not present

## 2016-12-13 DIAGNOSIS — Z9981 Dependence on supplemental oxygen: Secondary | ICD-10-CM | POA: Diagnosis not present

## 2016-12-13 DIAGNOSIS — R2689 Other abnormalities of gait and mobility: Secondary | ICD-10-CM | POA: Diagnosis not present

## 2016-12-13 DIAGNOSIS — J9611 Chronic respiratory failure with hypoxia: Secondary | ICD-10-CM | POA: Diagnosis not present

## 2016-12-13 DIAGNOSIS — Z79899 Other long term (current) drug therapy: Secondary | ICD-10-CM | POA: Diagnosis not present

## 2016-12-13 DIAGNOSIS — E119 Type 2 diabetes mellitus without complications: Secondary | ICD-10-CM | POA: Diagnosis not present

## 2016-12-13 DIAGNOSIS — E785 Hyperlipidemia, unspecified: Secondary | ICD-10-CM | POA: Diagnosis not present

## 2016-12-13 DIAGNOSIS — R278 Other lack of coordination: Secondary | ICD-10-CM | POA: Diagnosis not present

## 2016-12-13 DIAGNOSIS — Z7901 Long term (current) use of anticoagulants: Secondary | ICD-10-CM | POA: Diagnosis not present

## 2016-12-13 DIAGNOSIS — I482 Chronic atrial fibrillation: Secondary | ICD-10-CM | POA: Diagnosis not present

## 2016-12-13 DIAGNOSIS — Z794 Long term (current) use of insulin: Secondary | ICD-10-CM | POA: Diagnosis not present

## 2016-12-13 DIAGNOSIS — Z7401 Bed confinement status: Secondary | ICD-10-CM | POA: Diagnosis not present

## 2016-12-13 DIAGNOSIS — E1122 Type 2 diabetes mellitus with diabetic chronic kidney disease: Secondary | ICD-10-CM | POA: Diagnosis not present

## 2016-12-13 DIAGNOSIS — R531 Weakness: Secondary | ICD-10-CM | POA: Diagnosis not present

## 2016-12-13 DIAGNOSIS — G894 Chronic pain syndrome: Secondary | ICD-10-CM | POA: Diagnosis not present

## 2016-12-13 DIAGNOSIS — E1140 Type 2 diabetes mellitus with diabetic neuropathy, unspecified: Secondary | ICD-10-CM | POA: Diagnosis not present

## 2016-12-16 DIAGNOSIS — I482 Chronic atrial fibrillation: Secondary | ICD-10-CM | POA: Diagnosis not present

## 2016-12-16 DIAGNOSIS — M1990 Unspecified osteoarthritis, unspecified site: Secondary | ICD-10-CM | POA: Diagnosis not present

## 2016-12-16 DIAGNOSIS — Z79899 Other long term (current) drug therapy: Secondary | ICD-10-CM | POA: Diagnosis not present

## 2016-12-16 DIAGNOSIS — E785 Hyperlipidemia, unspecified: Secondary | ICD-10-CM | POA: Diagnosis not present

## 2016-12-16 DIAGNOSIS — R799 Abnormal finding of blood chemistry, unspecified: Secondary | ICD-10-CM | POA: Diagnosis not present

## 2016-12-23 DIAGNOSIS — N189 Chronic kidney disease, unspecified: Secondary | ICD-10-CM | POA: Diagnosis not present

## 2016-12-23 DIAGNOSIS — N289 Disorder of kidney and ureter, unspecified: Secondary | ICD-10-CM | POA: Diagnosis not present

## 2016-12-23 DIAGNOSIS — E1122 Type 2 diabetes mellitus with diabetic chronic kidney disease: Secondary | ICD-10-CM | POA: Diagnosis not present

## 2016-12-23 DIAGNOSIS — R791 Abnormal coagulation profile: Secondary | ICD-10-CM | POA: Diagnosis not present

## 2016-12-23 DIAGNOSIS — N179 Acute kidney failure, unspecified: Secondary | ICD-10-CM | POA: Diagnosis not present

## 2016-12-23 DIAGNOSIS — R4182 Altered mental status, unspecified: Secondary | ICD-10-CM | POA: Diagnosis not present

## 2016-12-23 DIAGNOSIS — Z794 Long term (current) use of insulin: Secondary | ICD-10-CM | POA: Diagnosis not present

## 2016-12-23 DIAGNOSIS — I129 Hypertensive chronic kidney disease with stage 1 through stage 4 chronic kidney disease, or unspecified chronic kidney disease: Secondary | ICD-10-CM | POA: Diagnosis not present

## 2016-12-23 DIAGNOSIS — R799 Abnormal finding of blood chemistry, unspecified: Secondary | ICD-10-CM | POA: Diagnosis not present

## 2016-12-25 DIAGNOSIS — Z7901 Long term (current) use of anticoagulants: Secondary | ICD-10-CM | POA: Diagnosis not present

## 2016-12-29 DIAGNOSIS — N318 Other neuromuscular dysfunction of bladder: Secondary | ICD-10-CM | POA: Diagnosis not present

## 2016-12-29 DIAGNOSIS — B3741 Candidal cystitis and urethritis: Secondary | ICD-10-CM | POA: Diagnosis not present

## 2016-12-29 DIAGNOSIS — N3090 Cystitis, unspecified without hematuria: Secondary | ICD-10-CM | POA: Diagnosis not present

## 2016-12-29 DIAGNOSIS — N3020 Other chronic cystitis without hematuria: Secondary | ICD-10-CM | POA: Diagnosis not present

## 2016-12-30 DIAGNOSIS — Z7901 Long term (current) use of anticoagulants: Secondary | ICD-10-CM | POA: Diagnosis not present

## 2016-12-31 DIAGNOSIS — Z7901 Long term (current) use of anticoagulants: Secondary | ICD-10-CM | POA: Diagnosis not present

## 2017-01-02 DIAGNOSIS — Z7901 Long term (current) use of anticoagulants: Secondary | ICD-10-CM | POA: Diagnosis not present

## 2017-01-02 DIAGNOSIS — T8131XS Disruption of external operation (surgical) wound, not elsewhere classified, sequela: Secondary | ICD-10-CM | POA: Diagnosis not present

## 2017-01-02 DIAGNOSIS — I4891 Unspecified atrial fibrillation: Secondary | ICD-10-CM | POA: Diagnosis not present

## 2017-01-03 DIAGNOSIS — S8261XD Displaced fracture of lateral malleolus of right fibula, subsequent encounter for closed fracture with routine healing: Secondary | ICD-10-CM | POA: Diagnosis not present

## 2017-01-11 DIAGNOSIS — N189 Chronic kidney disease, unspecified: Secondary | ICD-10-CM | POA: Diagnosis not present

## 2017-01-11 DIAGNOSIS — N289 Disorder of kidney and ureter, unspecified: Secondary | ICD-10-CM | POA: Diagnosis not present

## 2017-01-11 DIAGNOSIS — I129 Hypertensive chronic kidney disease with stage 1 through stage 4 chronic kidney disease, or unspecified chronic kidney disease: Secondary | ICD-10-CM | POA: Diagnosis not present

## 2017-01-11 DIAGNOSIS — E860 Dehydration: Secondary | ICD-10-CM | POA: Diagnosis not present

## 2017-01-11 DIAGNOSIS — E875 Hyperkalemia: Secondary | ICD-10-CM | POA: Diagnosis not present

## 2017-01-11 DIAGNOSIS — R7889 Finding of other specified substances, not normally found in blood: Secondary | ICD-10-CM | POA: Diagnosis not present

## 2017-01-11 DIAGNOSIS — E1122 Type 2 diabetes mellitus with diabetic chronic kidney disease: Secondary | ICD-10-CM | POA: Diagnosis not present

## 2017-01-11 DIAGNOSIS — N3000 Acute cystitis without hematuria: Secondary | ICD-10-CM | POA: Diagnosis not present

## 2017-01-11 DIAGNOSIS — R4182 Altered mental status, unspecified: Secondary | ICD-10-CM | POA: Diagnosis not present

## 2017-01-11 DIAGNOSIS — R031 Nonspecific low blood-pressure reading: Secondary | ICD-10-CM | POA: Diagnosis not present

## 2017-01-11 DIAGNOSIS — N179 Acute kidney failure, unspecified: Secondary | ICD-10-CM | POA: Diagnosis not present

## 2017-01-11 DIAGNOSIS — I517 Cardiomegaly: Secondary | ICD-10-CM | POA: Diagnosis not present

## 2017-01-12 ENCOUNTER — Telehealth: Payer: Self-pay | Admitting: Internal Medicine

## 2017-01-12 ENCOUNTER — Inpatient Hospital Stay (HOSPITAL_COMMUNITY)
Admission: AD | Admit: 2017-01-12 | Discharge: 2017-01-16 | DRG: 682 | Disposition: A | Discharge status: Skilled Nursing Facility | Payer: Medicare Other | Source: Other Acute Inpatient Hospital | Attending: Family Medicine | Admitting: Family Medicine

## 2017-01-12 ENCOUNTER — Inpatient Hospital Stay (HOSPITAL_COMMUNITY): Payer: Medicare Other

## 2017-01-12 DIAGNOSIS — Z7901 Long term (current) use of anticoagulants: Secondary | ICD-10-CM

## 2017-01-12 DIAGNOSIS — N289 Disorder of kidney and ureter, unspecified: Secondary | ICD-10-CM | POA: Diagnosis not present

## 2017-01-12 DIAGNOSIS — N390 Urinary tract infection, site not specified: Secondary | ICD-10-CM | POA: Diagnosis not present

## 2017-01-12 DIAGNOSIS — S91009A Unspecified open wound, unspecified ankle, initial encounter: Secondary | ICD-10-CM

## 2017-01-12 DIAGNOSIS — N183 Chronic kidney disease, stage 3 unspecified: Secondary | ICD-10-CM | POA: Diagnosis present

## 2017-01-12 DIAGNOSIS — I482 Chronic atrial fibrillation, unspecified: Secondary | ICD-10-CM | POA: Diagnosis present

## 2017-01-12 DIAGNOSIS — E1169 Type 2 diabetes mellitus with other specified complication: Secondary | ICD-10-CM | POA: Diagnosis present

## 2017-01-12 DIAGNOSIS — R627 Adult failure to thrive: Secondary | ICD-10-CM | POA: Diagnosis present

## 2017-01-12 DIAGNOSIS — Z79899 Other long term (current) drug therapy: Secondary | ICD-10-CM

## 2017-01-12 DIAGNOSIS — N3000 Acute cystitis without hematuria: Secondary | ICD-10-CM | POA: Diagnosis not present

## 2017-01-12 DIAGNOSIS — R6510 Systemic inflammatory response syndrome (SIRS) of non-infectious origin without acute organ dysfunction: Secondary | ICD-10-CM | POA: Diagnosis present

## 2017-01-12 DIAGNOSIS — E118 Type 2 diabetes mellitus with unspecified complications: Secondary | ICD-10-CM | POA: Diagnosis not present

## 2017-01-12 DIAGNOSIS — E11622 Type 2 diabetes mellitus with other skin ulcer: Secondary | ICD-10-CM | POA: Diagnosis present

## 2017-01-12 DIAGNOSIS — I272 Pulmonary hypertension, unspecified: Secondary | ICD-10-CM | POA: Diagnosis present

## 2017-01-12 DIAGNOSIS — N179 Acute kidney failure, unspecified: Secondary | ICD-10-CM | POA: Diagnosis not present

## 2017-01-12 DIAGNOSIS — Z794 Long term (current) use of insulin: Secondary | ICD-10-CM | POA: Diagnosis not present

## 2017-01-12 DIAGNOSIS — J449 Chronic obstructive pulmonary disease, unspecified: Secondary | ICD-10-CM | POA: Diagnosis present

## 2017-01-12 DIAGNOSIS — D62 Acute posthemorrhagic anemia: Secondary | ICD-10-CM | POA: Diagnosis present

## 2017-01-12 DIAGNOSIS — R4182 Altered mental status, unspecified: Secondary | ICD-10-CM | POA: Diagnosis not present

## 2017-01-12 DIAGNOSIS — Z66 Do not resuscitate: Secondary | ICD-10-CM | POA: Diagnosis present

## 2017-01-12 DIAGNOSIS — R1032 Left lower quadrant pain: Secondary | ICD-10-CM

## 2017-01-12 DIAGNOSIS — M869 Osteomyelitis, unspecified: Secondary | ICD-10-CM

## 2017-01-12 DIAGNOSIS — Z8619 Personal history of other infectious and parasitic diseases: Secondary | ICD-10-CM

## 2017-01-12 DIAGNOSIS — L89152 Pressure ulcer of sacral region, stage 2: Secondary | ICD-10-CM | POA: Diagnosis present

## 2017-01-12 DIAGNOSIS — J431 Panlobular emphysema: Secondary | ICD-10-CM | POA: Diagnosis not present

## 2017-01-12 DIAGNOSIS — L97919 Non-pressure chronic ulcer of unspecified part of right lower leg with unspecified severity: Secondary | ICD-10-CM | POA: Diagnosis present

## 2017-01-12 DIAGNOSIS — M86161 Other acute osteomyelitis, right tibia and fibula: Secondary | ICD-10-CM | POA: Diagnosis not present

## 2017-01-12 DIAGNOSIS — D689 Coagulation defect, unspecified: Secondary | ICD-10-CM | POA: Diagnosis present

## 2017-01-12 DIAGNOSIS — I420 Dilated cardiomyopathy: Secondary | ICD-10-CM | POA: Diagnosis present

## 2017-01-12 DIAGNOSIS — I509 Heart failure, unspecified: Secondary | ICD-10-CM

## 2017-01-12 DIAGNOSIS — N170 Acute kidney failure with tubular necrosis: Secondary | ICD-10-CM | POA: Diagnosis not present

## 2017-01-12 DIAGNOSIS — E875 Hyperkalemia: Secondary | ICD-10-CM | POA: Diagnosis not present

## 2017-01-12 DIAGNOSIS — M868X7 Other osteomyelitis, ankle and foot: Secondary | ICD-10-CM | POA: Diagnosis present

## 2017-01-12 DIAGNOSIS — L89892 Pressure ulcer of other site, stage 2: Secondary | ICD-10-CM | POA: Diagnosis not present

## 2017-01-12 DIAGNOSIS — R109 Unspecified abdominal pain: Secondary | ICD-10-CM | POA: Diagnosis not present

## 2017-01-12 DIAGNOSIS — E1165 Type 2 diabetes mellitus with hyperglycemia: Secondary | ICD-10-CM | POA: Diagnosis present

## 2017-01-12 DIAGNOSIS — I10 Essential (primary) hypertension: Secondary | ICD-10-CM | POA: Diagnosis present

## 2017-01-12 DIAGNOSIS — E785 Hyperlipidemia, unspecified: Secondary | ICD-10-CM | POA: Diagnosis present

## 2017-01-12 DIAGNOSIS — E1122 Type 2 diabetes mellitus with diabetic chronic kidney disease: Secondary | ICD-10-CM

## 2017-01-12 DIAGNOSIS — S91001D Unspecified open wound, right ankle, subsequent encounter: Secondary | ICD-10-CM | POA: Diagnosis not present

## 2017-01-12 DIAGNOSIS — B952 Enterococcus as the cause of diseases classified elsewhere: Secondary | ICD-10-CM | POA: Diagnosis present

## 2017-01-12 DIAGNOSIS — I130 Hypertensive heart and chronic kidney disease with heart failure and stage 1 through stage 4 chronic kidney disease, or unspecified chronic kidney disease: Secondary | ICD-10-CM | POA: Diagnosis present

## 2017-01-12 DIAGNOSIS — I129 Hypertensive chronic kidney disease with stage 1 through stage 4 chronic kidney disease, or unspecified chronic kidney disease: Secondary | ICD-10-CM | POA: Diagnosis not present

## 2017-01-12 DIAGNOSIS — E039 Hypothyroidism, unspecified: Secondary | ICD-10-CM | POA: Diagnosis present

## 2017-01-12 DIAGNOSIS — Z1612 Extended spectrum beta lactamase (ESBL) resistance: Secondary | ICD-10-CM | POA: Diagnosis present

## 2017-01-12 DIAGNOSIS — S91001A Unspecified open wound, right ankle, initial encounter: Secondary | ICD-10-CM | POA: Diagnosis not present

## 2017-01-12 DIAGNOSIS — E860 Dehydration: Secondary | ICD-10-CM | POA: Diagnosis present

## 2017-01-12 DIAGNOSIS — R531 Weakness: Secondary | ICD-10-CM | POA: Diagnosis not present

## 2017-01-12 DIAGNOSIS — I422 Other hypertrophic cardiomyopathy: Secondary | ICD-10-CM

## 2017-01-12 DIAGNOSIS — E0781 Sick-euthyroid syndrome: Secondary | ICD-10-CM | POA: Diagnosis present

## 2017-01-12 DIAGNOSIS — S82831A Other fracture of upper and lower end of right fibula, initial encounter for closed fracture: Secondary | ICD-10-CM | POA: Diagnosis not present

## 2017-01-12 DIAGNOSIS — G9341 Metabolic encephalopathy: Secondary | ICD-10-CM | POA: Diagnosis not present

## 2017-01-12 DIAGNOSIS — A419 Sepsis, unspecified organism: Secondary | ICD-10-CM

## 2017-01-12 DIAGNOSIS — S82899S Other fracture of unspecified lower leg, sequela: Secondary | ICD-10-CM | POA: Diagnosis not present

## 2017-01-12 DIAGNOSIS — Z87891 Personal history of nicotine dependence: Secondary | ICD-10-CM

## 2017-01-12 DIAGNOSIS — T460X5A Adverse effect of cardiac-stimulant glycosides and drugs of similar action, initial encounter: Secondary | ICD-10-CM | POA: Diagnosis present

## 2017-01-12 DIAGNOSIS — R451 Restlessness and agitation: Secondary | ICD-10-CM | POA: Diagnosis not present

## 2017-01-12 LAB — URINALYSIS, ROUTINE W REFLEX MICROSCOPIC
BILIRUBIN URINE: NEGATIVE
Glucose, UA: NEGATIVE mg/dL
Hgb urine dipstick: NEGATIVE
KETONES UR: NEGATIVE mg/dL
Nitrite: NEGATIVE
PROTEIN: NEGATIVE mg/dL
SQUAMOUS EPITHELIAL / LPF: NONE SEEN
Specific Gravity, Urine: 1.013 (ref 1.005–1.030)
pH: 5 (ref 5.0–8.0)

## 2017-01-12 LAB — CBC WITH DIFFERENTIAL/PLATELET
Basophils Absolute: 0.1 10*3/uL (ref 0.0–0.1)
Basophils Relative: 1 %
EOS ABS: 0.2 10*3/uL (ref 0.0–0.7)
EOS PCT: 2 %
HCT: 33.4 % — ABNORMAL LOW (ref 36.0–46.0)
Hemoglobin: 10.3 g/dL — ABNORMAL LOW (ref 12.0–15.0)
LYMPHS PCT: 22 %
Lymphs Abs: 2.4 10*3/uL (ref 0.7–4.0)
MCH: 30.1 pg (ref 26.0–34.0)
MCHC: 30.8 g/dL (ref 30.0–36.0)
MCV: 97.7 fL (ref 78.0–100.0)
Monocytes Absolute: 1.1 10*3/uL — ABNORMAL HIGH (ref 0.1–1.0)
Monocytes Relative: 10 %
NEUTROS ABS: 7.1 10*3/uL (ref 1.7–7.7)
NEUTROS PCT: 65 %
Platelets: 288 10*3/uL (ref 150–400)
RBC: 3.42 MIL/uL — ABNORMAL LOW (ref 3.87–5.11)
RDW: 17.7 % — ABNORMAL HIGH (ref 11.5–15.5)
WBC: 10.9 10*3/uL — ABNORMAL HIGH (ref 4.0–10.5)

## 2017-01-12 LAB — RESPIRATORY PANEL BY PCR
Adenovirus: NOT DETECTED
BORDETELLA PERTUSSIS-RVPCR: NOT DETECTED
CORONAVIRUS 229E-RVPPCR: NOT DETECTED
Chlamydophila pneumoniae: NOT DETECTED
Coronavirus HKU1: NOT DETECTED
Coronavirus NL63: NOT DETECTED
Coronavirus OC43: NOT DETECTED
INFLUENZA A-RVPPCR: NOT DETECTED
INFLUENZA B-RVPPCR: NOT DETECTED
METAPNEUMOVIRUS-RVPPCR: NOT DETECTED
MYCOPLASMA PNEUMONIAE-RVPPCR: NOT DETECTED
PARAINFLUENZA VIRUS 4-RVPPCR: NOT DETECTED
Parainfluenza Virus 1: NOT DETECTED
Parainfluenza Virus 2: NOT DETECTED
Parainfluenza Virus 3: NOT DETECTED
RESPIRATORY SYNCYTIAL VIRUS-RVPPCR: NOT DETECTED
Rhinovirus / Enterovirus: NOT DETECTED

## 2017-01-12 LAB — COMPREHENSIVE METABOLIC PANEL
ALBUMIN: 1.7 g/dL — AB (ref 3.5–5.0)
ALT: 13 U/L — AB (ref 14–54)
ANION GAP: 10 (ref 5–15)
AST: 45 U/L — ABNORMAL HIGH (ref 15–41)
Alkaline Phosphatase: 63 U/L (ref 38–126)
BUN: 109 mg/dL — ABNORMAL HIGH (ref 6–20)
CALCIUM: 7.7 mg/dL — AB (ref 8.9–10.3)
CHLORIDE: 110 mmol/L (ref 101–111)
CO2: 25 mmol/L (ref 22–32)
CREATININE: 3.87 mg/dL — AB (ref 0.44–1.00)
GFR calc non Af Amer: 10 mL/min — ABNORMAL LOW (ref >60)
GFR, EST AFRICAN AMERICAN: 12 mL/min — AB (ref >60)
GLUCOSE: 101 mg/dL — AB (ref 65–99)
Potassium: 6.2 mmol/L — ABNORMAL HIGH (ref 3.5–5.1)
SODIUM: 145 mmol/L (ref 135–145)
Total Bilirubin: 1.2 mg/dL (ref 0.3–1.2)
Total Protein: 5 g/dL — ABNORMAL LOW (ref 6.5–8.1)

## 2017-01-12 LAB — GLUCOSE, CAPILLARY
GLUCOSE-CAPILLARY: 98 mg/dL (ref 65–99)
GLUCOSE-CAPILLARY: 104 mg/dL — AB (ref 65–99)
GLUCOSE-CAPILLARY: 98 mg/dL (ref 65–99)
GLUCOSE-CAPILLARY: 96 mg/dL (ref 65–99)

## 2017-01-12 LAB — DIGOXIN LEVEL: DIGOXIN LVL: 2 ng/mL (ref 0.8–2.0)

## 2017-01-12 LAB — PROTIME-INR
INR: 2.26
PROTHROMBIN TIME: 25.3 s — AB (ref 11.4–15.2)

## 2017-01-12 LAB — CK: Total CK: 145 U/L (ref 38–234)

## 2017-01-12 LAB — MRSA PCR SCREENING: MRSA BY PCR: NEGATIVE

## 2017-01-12 LAB — TSH: TSH: 1.84 u[IU]/mL (ref 0.350–4.500)

## 2017-01-12 MED ORDER — SODIUM CHLORIDE 0.9% FLUSH
3.0000 mL | Freq: Two times a day (BID) | INTRAVENOUS | Status: DC
  Administered 2017-01-13 – 2017-01-16 (×5): 3 mL via INTRAVENOUS

## 2017-01-12 MED ORDER — METOPROLOL TARTRATE 5 MG/5ML IV SOLN
5.0000 mg | Freq: Three times a day (TID) | INTRAVENOUS | Status: DC
  Administered 2017-01-12 – 2017-01-13 (×4): 5 mg via INTRAVENOUS
  Filled 2017-01-12 (×4): qty 5

## 2017-01-12 MED ORDER — DEXTROSE 5 % IV SOLN
1.0000 g | INTRAVENOUS | Status: DC
  Administered 2017-01-12 – 2017-01-16 (×5): 1 g via INTRAVENOUS
  Filled 2017-01-12 (×5): qty 1

## 2017-01-12 MED ORDER — WARFARIN - PHARMACIST DOSING INPATIENT: Freq: Every day | Status: DC

## 2017-01-12 MED ORDER — INSULIN ASPART 100 UNIT/ML ~~LOC~~ SOLN
0.0000 [IU] | SUBCUTANEOUS | Status: DC
  Administered 2017-01-13 (×2): 1 [IU] via SUBCUTANEOUS
  Administered 2017-01-14 – 2017-01-15 (×7): 2 [IU] via SUBCUTANEOUS
  Administered 2017-01-15 – 2017-01-16 (×2): 1 [IU] via SUBCUTANEOUS
  Administered 2017-01-16 (×2): 2 [IU] via SUBCUTANEOUS
  Administered 2017-01-16: 1 [IU] via SUBCUTANEOUS

## 2017-01-12 MED ORDER — ENOXAPARIN SODIUM 40 MG/0.4ML ~~LOC~~ SOLN
40.0000 mg | SUBCUTANEOUS | Status: DC
  Administered 2017-01-12: 40 mg via SUBCUTANEOUS
  Filled 2017-01-12: qty 0.4

## 2017-01-12 MED ORDER — VANCOMYCIN HCL 10 G IV SOLR
1500.0000 mg | Freq: Once | INTRAVENOUS | Status: AC
  Administered 2017-01-12: 1500 mg via INTRAVENOUS
  Filled 2017-01-12: qty 1500

## 2017-01-12 MED ORDER — WARFARIN SODIUM 1 MG PO TABS
1.0000 mg | ORAL_TABLET | Freq: Once | ORAL | Status: DC
  Filled 2017-01-12: qty 1

## 2017-01-12 MED ORDER — SODIUM CHLORIDE 0.9 % IV SOLN
INTRAVENOUS | Status: DC
  Administered 2017-01-12 – 2017-01-13 (×4): via INTRAVENOUS

## 2017-01-12 MED ORDER — ACETAMINOPHEN 650 MG RE SUPP: 650.0000 mg | Freq: Four times a day (QID) | RECTAL | Status: DC | PRN

## 2017-01-12 MED ORDER — LEVOTHYROXINE SODIUM 100 MCG IV SOLR
12.5000 ug | Freq: Every day | INTRAVENOUS | Status: DC
  Administered 2017-01-12 – 2017-01-14 (×3): 12.5 ug via INTRAVENOUS
  Filled 2017-01-12 (×3): qty 5

## 2017-01-12 MED ORDER — ACETAMINOPHEN 325 MG PO TABS
650.0000 mg | ORAL_TABLET | Freq: Four times a day (QID) | ORAL | Status: DC | PRN
  Administered 2017-01-13 – 2017-01-16 (×6): 650 mg via ORAL
  Filled 2017-01-12 (×6): qty 2

## 2017-01-12 MED ORDER — IPRATROPIUM-ALBUTEROL 0.5-2.5 (3) MG/3ML IN SOLN: 3.0000 mL | RESPIRATORY_TRACT | Status: DC | PRN

## 2017-01-12 NOTE — NC FL2 (Signed)
[place] MEDICAID FL2 LEVEL OF CARE SCREENING TOOL     IDENTIFICATION  Patient Name: Danielle Woodard Birthdate: [birth year] Sex: female Admission Date (Current Location): 01/12/2017  [facility] and [place] Number:  [employer] and Address:  [facility] [place], [place] [address] [place], [place] [address]      Provider Number: [id number]  Attending Physician Name and Address:  Waldemar Dickens, MD  Relative Name and Phone Number:       Current Level of Care: Hospital Recommended Level of Care: [place] Prior Approval Number:    Date Approved/Denied:   PASRR Number:    Discharge Plan: SNF    Current Diagnoses: Patient Active Problem List   Diagnosis Date Noted  . CKD (chronic kidney disease) stage 3, GFR 30-59 ml/min 01/12/2017  . Decubitus ulcer of right leg, stage 2 01/12/2017  . COPD (chronic obstructive pulmonary disease) ([place]) 01/12/2017  . History of Clostridium difficile colitis 01/12/2017  . SIRS (systemic inflammatory response syndrome) ([place]) 01/12/2017  . Sacral decubitus ulcer, stage II 01/12/2017  . Open ankle wound   . Diabetes mellitus with complication ([place])   . Pressure injury of skin 10/29/2016  . Clostridium difficile colitis 10/28/2016  . CHF NYHA class I, unspecified failure chronicity, unspecified type ([place]) 10/28/2016  . AKI (acute kidney injury) ([place]) 10/28/2016  . Metabolic encephalopathy [id number]  . Closed right ankle fracture, with routine healing, subsequent encounter 10/28/2016  . Type 2 diabetes mellitus with stage 3 chronic kidney disease, with long-term current use of insulin ([place]) 10/28/2016  . Essential hypertension 10/28/2016  . Chronic atrial fibrillation ([place]) 10/28/2016  . Hypothyroidism, acquired 10/28/2016    Orientation RESPIRATION BLADDER Height & Weight     Self  O2 (2L [place]) Incontinent, Indwelling catheter Weight: 199 lb 4.7 oz (90.4 kg) Height:  5\' 3"  (160 cm)  BEHAVIORAL  SYMPTOMS/MOOD NEUROLOGICAL BOWEL NUTRITION STATUS      Continent Diet (see dc summary)  AMBULATORY STATUS COMMUNICATION OF NEEDS Skin     Verbally Wound Vac, PU Stage and Appropriate Care   PU Stage 2 Dressing:  (buttock)                   Personal Care Assistance Level of Assistance  Bathing, Dressing           Functional Limitations Info             SPECIAL CARE FACTORS FREQUENCY  PT (By licensed PT), OT (By licensed OT)     PT Frequency: 5/wk OT Frequency: 5/wk            Contractures      Additional Factors Info  Code Status, Allergies, Isolation Precautions, Insulin Sliding Scale Code Status Info: DNR Allergies Info: NKA   Insulin Sliding Scale Info: 6/day Isolation Precautions Info: droplet     Current Medications (01/12/2017):  This is the current hospital active medication list Current Facility-Administered Medications  Medication Dose Route Frequency Provider Last Rate Last Dose  . 0.9 %  sodium chloride infusion   Intravenous Continuous Samella Parr, NP 125 mL/hr at 01/12/17 0756    . acetaminophen (TYLENOL) tablet 650 mg  650 mg Oral Q6H PRN Samella Parr, NP       Or  . acetaminophen (TYLENOL) suppository 650 mg  650 mg Rectal Q6H PRN Samella Parr, NP      . ceFEPIme (MAXIPIME) 1 g in dextrose 5 % 50 mL IVPB  1  g Intravenous Q24H Rolla Flatten, RPH   1 g at 01/12/17 1104  . insulin aspart (novoLOG) injection 0-9 Units  0-9 Units Subcutaneous Q4H Samella Parr, NP      . ipratropium-albuterol (DUONEB) 0.5-2.5 (3) MG/3ML nebulizer solution 3 mL  3 mL Nebulization Q4H PRN Samella Parr, NP      . levothyroxine (SYNTHROID, LEVOTHROID) injection 12.5 mcg  12.5 mcg Intravenous Daily Samella Parr, NP   12.5 mcg at 01/12/17 0843  . metoprolol (LOPRESSOR) injection 5 mg  5 mg Intravenous Q8H Samella Parr, NP   5 mg at 01/12/17 1417  . sodium chloride flush (NS) 0.9 % injection 3 mL  3 mL Intravenous Q12H Samella Parr, NP          Discharge Medications: Please see discharge summary for a list of discharge medications.  Relevant Imaging Results:  Relevant Lab Results:   Additional Information SS#: [id number]  Jorge Ny, LCSW

## 2017-01-12 NOTE — Telephone Encounter (Signed)
Called by [facility] for transfer: 76 yo F in [place].  In for weakness and confusion.  AKI creat of 4.8 and BUN 125, has h/o CKD but discharge creat in Dec (also for an AKI admission) here to [facility] was 1.5 on Dec 14th.  Patient reportedly has UTI as well.  No PNA no new o2 requirement (wears 2 L via  at baseline).  Very dehydrated patient has gotten 2.5L at [facility], Repeat BMP creat from 4.8 to 4.5, BUN went 125 to 120.  K is 6.3 on both panels got 15gm kayexelate, 1 sodium bicarb, calcium gluconate, insulin and glucose.  Patient is a DNR.  But family is considering dialysis.  Patient with encephalopathy (probably uremic).  And given K of 6.3, will put in for SDU.

## 2017-01-12 NOTE — Consult Note (Addendum)
[place] Nurse wound consult note Reason for Consult: Consult requested for negative pressure device dressing change.  Pt was admitted from a SNF, where she had a negative pressure device prior to admission.  There is no machine in the room and the dressing was clamped and left in place to right lower leg.  It is a different brand from the Vac. It is hospital policy to apply our negative pressure device during the hospital stay, and she can resume use of the other device after discharge. Wound type: Full thickness chronic wound to right outer ankle; appears to be a surgical wound with exposed hardware rod in the middle of the wound Measurement: 9X4X.3cm, 85% red, 15% yellow Drainage (amount, consistency, odor) Small amt yellow drainage, no odor Periwound: red macerated skin surrounding the wound to 2 cm Dressing procedure/placement/frequency: Applied one piece black foam to 137mm cont suction.  Pt was medicated prior to the procedure but still had mod amt discomfort.   Plan for bedside nurse to change on Sat. Pt can resume followup with her previous physician after discharge. Julien Girt MSN, RN, [place], [place], [place]

## 2017-01-12 NOTE — Consult Note (Signed)
Reason for Consult:Right ankle wound Referring Physician: D Arlisha Woodard is an 76 y.o. female.  HPI: Danielle Woodard was transferred to [facility] for AKI discovered during evaluation of lethargy at [facility] ED. She was previously at a SNF. She had received an ORIF of a right distal fibula fx. At some point, likely since December as a hospital admission then makes no note of a right ankle wound, she developed a wound and is currently being treated with a wound VAC. It is unclear who is managing that. She is unable to contribute much to history, occasionally answering questions with one word answers and occasionally with nonsensical answers. There was no family present.  Past Medical History:  Diagnosis Date  . CHF (congestive heart failure) ([place])   . Chronic kidney disease   . Diabetes mellitus without complication ([place])   . Dysrhythmia   . Hypertension   . Hypothyroidism     No past surgical history on file.  No family history on file.  Social History:  reports that she has quit smoking. She has never used smokeless tobacco. Her alcohol and drug histories are not on file.  Allergies: No Known Allergies  Medications: I have reviewed the patient's current medications.  Results for orders placed or performed during the hospital encounter of 01/12/17 (from the past 48 hour(s))  MRSA PCR Screening     Status: None   Collection Time: 01/12/17  3:57 AM  Result Value Ref Range   MRSA by PCR NEGATIVE NEGATIVE    Comment:        The GeneXpert MRSA Assay (FDA approved for NASAL specimens only), is one component of a comprehensive MRSA colonization surveillance program. It is not intended to diagnose MRSA infection nor to guide or monitor treatment for MRSA infections.   Comprehensive metabolic panel     Status: Abnormal   Collection Time: 01/12/17  7:45 AM  Result Value Ref Range   Sodium 145 135 - 145 mmol/L   Potassium 6.2 (H) 3.5 - 5.1 mmol/L    Comment: SLIGHT  HEMOLYSIS   Chloride 110 101 - 111 mmol/L   CO2 25 22 - 32 mmol/L   Glucose, Bld 101 (H) 65 - 99 mg/dL   BUN 109 (H) 6 - 20 mg/dL   Creatinine, Ser 3.87 (H) 0.44 - 1.00 mg/dL   Calcium 7.7 (L) 8.9 - 10.3 mg/dL   Total Protein 5.0 (L) 6.5 - 8.1 g/dL   Albumin 1.7 (L) 3.5 - 5.0 g/dL   AST 45 (H) 15 - 41 U/L   ALT 13 (L) 14 - 54 U/L   Alkaline Phosphatase 63 38 - 126 U/L   Total Bilirubin 1.2 0.3 - 1.2 mg/dL   GFR calc non Af Amer 10 (L) >60 mL/min   GFR calc Af Amer 12 (L) >60 mL/min    Comment: (NOTE) The eGFR has been calculated using the CKD EPI equation. This calculation has not been validated in all clinical situations. eGFR's persistently <60 mL/min signify possible Chronic Kidney Disease.    Anion gap 10 5 - 15  CBC with Differential/Platelet     Status: Abnormal (Preliminary result)   Collection Time: 01/12/17  7:45 AM  Result Value Ref Range   WBC 10.9 (H) 4.0 - 10.5 K/uL    Comment: REPEATED TO VERIFY WHITE COUNT CONFIRMED ON SMEAR    RBC 3.42 (L) 3.87 - 5.11 MIL/uL   Hemoglobin 10.3 (L) 12.0 - 15.0 g/dL   HCT 33.4 (L) 36.0 -  46.0 %   MCV 97.7 78.0 - 100.0 fL   MCH 30.1 26.0 - 34.0 pg   MCHC 30.8 30.0 - 36.0 g/dL   RDW 17.7 (H) 11.5 - 15.5 %   Platelets 288 150 - 400 K/uL    Comment: REPEATED TO VERIFY PLATELET COUNT CONFIRMED BY SMEAR    Neutrophils Relative % 65 %   Lymphocytes Relative 22 %   Monocytes Relative 10 %   Eosinophils Relative 2 %   Basophils Relative 1 %   Neutro Abs 7.1 1.7 - 7.7 K/uL   Lymphs Abs 2.4 0.7 - 4.0 K/uL   Monocytes Absolute 1.1 (H) 0.1 - 1.0 K/uL   Eosinophils Absolute 0.2 0.0 - 0.7 K/uL   Basophils Absolute 0.1 0.0 - 0.1 K/uL   RBC Morphology TEARDROP CELLS   Digoxin level     Status: None   Collection Time: 01/12/17  7:45 AM  Result Value Ref Range   Digoxin Level 2.0 0.8 - 2.0 ng/mL  Protime-INR     Status: Abnormal   Collection Time: 01/12/17  7:45 AM  Result Value Ref Range   Prothrombin Time 25.3 (H) 11.4 - 15.2  seconds   INR 2.26   TSH     Status: None   Collection Time: 01/12/17  7:45 AM  Result Value Ref Range   TSH 1.840 0.350 - 4.500 uIU/mL    Comment: Performed by a 3rd Generation assay with a functional sensitivity of <=0.01 uIU/mL.  CK     Status: None   Collection Time: 01/12/17  7:45 AM  Result Value Ref Range   Total CK 145 38 - 234 U/L  Urinalysis, Routine w reflex microscopic     Status: Abnormal   Collection Time: 01/12/17  7:46 AM  Result Value Ref Range   Color, Urine YELLOW YELLOW   APPearance HAZY (A) CLEAR   Specific Gravity, Urine 1.013 1.005 - 1.030   pH 5.0 5.0 - 8.0   Glucose, UA NEGATIVE NEGATIVE mg/dL   Hgb urine dipstick NEGATIVE NEGATIVE   Bilirubin Urine NEGATIVE NEGATIVE   Ketones, ur NEGATIVE NEGATIVE mg/dL   Protein, ur NEGATIVE NEGATIVE mg/dL   Nitrite NEGATIVE NEGATIVE   Leukocytes, UA MODERATE (A) NEGATIVE   RBC / HPF 0-5 0 - 5 RBC/hpf   WBC, UA TOO NUMEROUS TO COUNT 0 - 5 WBC/hpf   Bacteria, UA RARE (A) NONE SEEN   Squamous Epithelial / LPF NONE SEEN NONE SEEN   Mucous PRESENT    Hyaline Casts, UA PRESENT   Glucose, capillary     Status: None   Collection Time: 01/12/17  7:47 AM  Result Value Ref Range   Glucose-Capillary 98 65 - 99 mg/dL    Ct Ankle Right Wo Contrast  Result Date: 01/12/2017 CLINICAL DATA:  History of surgical repair of right ankle fracture in November, 2017 with chronic infection. Wound VAC placed EXAM: CT OF THE RIGHT ANKLE WITHOUT CONTRAST TECHNIQUE: Multidetector CT imaging of the right ankle was performed according to the standard protocol. Multiplanar CT image reconstructions were also generated. COMPARISON:  Intraoperative views of the right ankle 08/21/2016. Plain films right ankle 08/19/2016. FINDINGS: Bones/Joint/Cartilage Plate and screws are seen fixing a distal fibular fracture. Hardware is intact. There is no lucency about the screws. The fracture is in near anatomic position and alignment. Fracture lines remain  visible. Bones appear diffusely osteopenic. No bony destructive change or periosteal reaction is seen. Ligaments Suboptimally assessed by CT. Muscles and Tendons No intramuscular  fluid collection is identified.  No tear. Soft tissues Large soft tissue wound is seen about the patient's fixation hardware in the distal fibula. Portions of hardware and bone appear exposed to air. IMPRESSION: Large soft tissue wound about the distal fibula. Although no CT evidence of osteomyelitis is identified, the patient's fixation hardware and portions of the fibula appear exposed to air. Negative for soft tissue abscess. Fracture fixation hardware in the distal fibula is intact without evidence of loosening. The fracture is in anatomic position and alignment. Fracture lines remain visible. Electronically Signed   By: Inge Rise M.Danielle.   On: 01/12/2017 10:48   Dg Abd Portable 1v  Result Date: 01/12/2017 CLINICAL DATA:  Lethargic, nonspecific abdominal pain EXAM: PORTABLE ABDOMEN - 1 VIEW COMPARISON:  None. FINDINGS: Portable supine views of the abdomen show no evidence of bowel obstruction. No opaque calculi are seen. There are degenerative changes in the mid to lower thoracic spine and in the right hip. The SI joints appear corticated. IMPRESSION: No bowel obstruction.  No opaque calculi. Electronically Signed   By: Ivar Drape M.Danielle.   On: 01/12/2017 08:43    Review of Systems  Unable to perform ROS: Mental status change   Blood pressure (!) 106/42, pulse 74, temperature 97.8 F (36.6 C), temperature source Oral, resp. rate 18, height _0  (1.6 m), weight 90.4 kg (199 lb 4.7 oz), SpO2 100 %. Physical Exam  Constitutional: She appears well-developed and well-nourished. She appears lethargic. No distress.  HENT:  Head: Normocephalic and atraumatic.  Eyes: Conjunctivae are normal. Right eye exhibits no discharge. Left eye exhibits no discharge. No scleral icterus.  Cardiovascular: Normal rate.  An irregularly  irregular rhythm present.  Murmur heard.  Systolic murmur is present with a grade of 2/6  Pulses:      Dorsalis pedis pulses are 1+ on the right side.       Posterior tibial pulses are 0 on the right side.  Respiratory: Effort normal and breath sounds normal. No respiratory distress. She has no wheezes. She has no rales.  GI: Soft. She exhibits no distension. There is no tenderness. There is no rebound and no guarding.  Musculoskeletal:       Right ankle: Tenderness. Lateral malleolus tenderness found.       Feet:  VAC in place, surrounding erythema, no streaking, severe TTP, severe pain with PROM  Lymphadenopathy:    She has no cervical adenopathy.  Neurological: She appears lethargic.  Skin: Skin is warm.  Psychiatric: She has a normal mood and affect.    Assessment/Plan: Right ankle wound with exposed hardware s/p ORIF 11/17 -- Osteomyelitis until proven otherwise, recommend continuing dual broad-spectrum abx (current regimen appropriate) and routine VAC changes. ID involvement may be helpful, would recommend consult. She needs re-evaluation by her operating surgeon at earliest opportunity as her hardware needs to be removed. This does not need to be done acutely (i.e. During this admission) unless she begins to show signs of sepsis. Multiple medical problems -- per primary team    Danielle Gunner, PA-C Orthopedic Surgery 01/12/2017, 11:45 AM

## 2017-01-12 NOTE — Progress Notes (Signed)
Dr. Alcario Drought made aware of pts arrival from [facility] per admissions. Waiting on orders at this time.

## 2017-01-12 NOTE — Progress Notes (Signed)
Pharmacy Antibiotic Note  Danielle Woodard is a 76 y.o. female admitted on 01/12/2017 with AMS concerning for sepsis of possible urinary or wound source. Pharmacy has been consulted for Vancomycin + Cefepime dosing.  Per review of the [facility] records - it appears the patient received a dose of Rocephin 1g IV on 2/21 but no other antibiotics are recorded.   The patient is noted to have an acute kidney injury as the baseline appears to be 1.5-2 and admit SCr was 3.87. Given this will load Vancomycin and hold off on additional doses pending daily review of renal function.   Plan: 1. Vancomycin 1500 mg IV x 1 2. Cefepime 1g IV every 24 hours 3. Will continue to follow renal function, culture results, LOT, and antibiotic de-escalation plans   Height: 5\' 3"  (160 cm) Weight: 199 lb 4.7 oz (90.4 kg) IBW/kg (Calculated) : 52.4  Temp (24hrs), Avg:97.7 F (36.5 C), Min:97.5 F (36.4 C), Max:97.8 F (36.6 C)   Recent Labs Lab 01/12/17 0745  WBC PENDING  CREATININE 3.87*    Estimated Creatinine Clearance: 13.2 mL/min (by C-G formula based on SCr of 3.87 mg/dL (H)).    No Known Allergies  Antimicrobials this admission: Ceftriaxone 2/21 x 1 @ [place] [facility] 2/22 >> Cefepime 2/22 >>  Microbiology results: 2/22 BCx >> 2/22 MRSA PCR >> neg 2/22 RVP >> 2/22 UCx >>  Thank you for allowing pharmacy to be a part of this patient's care.  Alycia Rossetti, PharmD, BCPS Clinical Pharmacist Pager: [phone] Clinical phone for 01/12/2017 from 7a-3:30p: [phone] If after 3:30p, please call main pharmacy at: [phone] 01/12/2017 10:17 AM

## 2017-01-12 NOTE — H&P (Signed)
History and Physical    Danielle Woodard [id number] DOB: 25-Jun-1941 DOA: 01/12/2017   PCP: No primary care provider on file.   Patient coming from/Resides with: SNF  Admission status: Inpatient/SDU-medically necessary to stay a minimum 2 midnights to rule out impending and/or unexpected changes in physiologic status that may differ from initial evaluation performed in the ER and/or at time of admission. She presents with uremic encephalopathy in the setting of acute kidney injury presumed related to dehydration plus effects of preadmission medications. Also presents with digoxin toxicity secondary to acute kidney injury. Recent outpatient antibiotic treatment for presumed UTI now with generalized abdominal pain in patient with history of C. difficile colitis. Patient will require close monitoring of hemodynamic and neurological status with neuro checks every 2-4 hours. She will require telemetry monitoring given her history of atrial fibrillation and acute digoxin toxicity. She will require IV fluid hydration with frequent lab measurements noting she was hyperkalemic as well at outside ER. She also has a chronic right ankle wound with history of previous surgical repair of ankle fracture November 2017. She may have underlying infectious process potentially secondary to UTI, bacteremia, infected ankle appliances i.e. osteomyelitis. She is a DO NOT RESUSCITATE and it was reported at previous facility that family may be considering initiation of dialysis if renal function does not improve.  Chief Complaint: Altered mental status and abnormal labs.  HPI: Danielle Woodard is a 76 y.o. female with medical history significant for a E chronic kidney disease, diabetes on insulin, hypertension, chronic atrial fibrillation on warfarin, hypothyroidism, COPD, non-systolic CHF and setting of mild LVH. She was hospitalized in December 2017 secondary to acute kidney injury/volume depletion/sepsis secondary to  enterococcus UTI and C. difficile colitis. Patient had undergone surgical repair of an ankle fracture in late November with no documented wound during previous admission in December. She was subsequently discharged to skilled nursing facility. In January she was sent to the ER for epistaxis related to supratherapeutic INR. She has also had at least one ER visit for hypoxemia presumably related to volume overload and was given a dose of IV Lasix in the ER and discharged back to the facility.  On 2/21 patient was sent to the [facility] ER from the facility secondary to altered mental status and abnormal lab values. This documented that she had again" acute blood loss anemia and abnormal coagulopathy, PA notified of acute renal failure on labs". According to documentation that was sent with the patient from [facility] patient has had progressive altered mentation for 2 weeks with lethargy and poor oral intake with mentation worse over the past 48 hours. Reviewing the medication records reveals patient has been receiving Macrobid and Keflex with last dose due today on 2/22. This was given to treat presumed UTI as well as right lower extremity wound infection. She apparently has had flulike symptoms in the past several weeks with a course of Tamiflu given at the end of January and a 1 day course given on 2/12 with the presumption that her rapid influenza PCR screen was negative thus prompting discontinuation of that medication.  In the ER patient did have a significant acute kidney injury with a creatinine greater than 4 and a BUN greater than 120 with a potassium of 6.3. She was given fluids, cardioprotective medications and a dose of Kayexalate. It was mentioned that family was considering the possibility of hemodialysis that the patient was subsequently transferred to [facility] for further evaluation and treatment.  ED Course: [facility]) PCXR: Low lung volumes without focal  infiltrate EKG: Atrial fibrillation with right axis deviation, ventricular rate 84 bpm, QTC 392 ms, downsloping ST segments presumed related to known digitalis toxicity-no old EKGs for comparison Lab data: White count 12,700 with neutrophils 64% and absolute neutrophils 8.1%, hemoglobin 12.8, platelets 219,000, PT 25.4, INR 2.4, PTT 42.7, sodium 143, potassium 6.3, chloride 104, CO2 28, glucose 141, BUN 125, creatinine 4.8, anion gap 17, AST 67 otherwise LFTs within normal limits, osmolality elevated at 316, lactic acid 1.8, urinalysis abnormal with dark yellow appearance, opaque D, specific gravity 1.025, protein 1+, negative for glucose ketones or bilirubin, urobilinogen 2, leukocytes 2+, wbc's too numerous to count, bacteria 3+, epithelial cells 3+, digoxin level 2.3  Review of Systems:  **Unable to obtain from patient-history obtained from the medical record   Past Medical History:  Diagnosis Date  . CHF (congestive heart failure) ([place])   . Chronic kidney disease   . Diabetes mellitus without complication ([place])   . Dysrhythmia   . Hypertension   . Hypothyroidism     No past surgical history on file.  Social History   Social History  . Marital status: Widowed    Spouse name: N/A  . Number of children: N/A  . Years of education: N/A   Occupational History  . Not on file.   Social History Main Topics  . Smoking status: Former Research scientist (life sciences)  . Smokeless tobacco: Never Used  . Alcohol use Not on file  . Drug use: Unknown  . Sexual activity: Not on file   Other Topics Concern  . Not on file   Social History Narrative  . No narrative on file    Mobility: Unknown Work history: Unknown   No Known Allergies  Family history unable to be obtained from patient due to altered mentation   Prior to Admission medications   Medication Sig Start Date End Date Taking? Authorizing Provider  acetaminophen (TYLENOL) 500 MG tablet Take 500 mg by mouth 3 (three) times daily as needed  (pain).    Historical Provider, MD  albuterol (PROAIR HFA) 108 (90 Base) MCG/ACT inhaler Inhale 2 puffs into the lungs 4 (four) times daily as needed for wheezing or shortness of breath.    Historical Provider, MD  allopurinol (ZYLOPRIM) 300 MG tablet Take 300 mg by mouth daily.    Historical Provider, MD  Calcium Carbonate-Vitamin D (CALTRATE 600+D PO) Take 1 tablet by mouth daily.    Historical Provider, MD  cholecalciferol (VITAMIN D) 1000 units tablet Take 1,000 Units by mouth every other day.    Historical Provider, MD  digoxin (LANOXIN) 0.125 MG tablet Take 0.125 mg by mouth daily.    Historical Provider, MD  fenofibrate (TRICOR) 145 MG tablet Take 145 mg by mouth at bedtime.     Historical Provider, MD  furosemide (LASIX) 40 MG tablet Take 40 mg by mouth daily.    Historical Provider, MD  gabapentin (NEURONTIN) 100 MG capsule Take 100 mg by mouth 3 (three) times daily. 6am, 2pm, 10pm    Historical Provider, MD  insulin aspart protamine- aspart (NOVOLOG MIX 70/30) (70-30) 100 UNIT/ML injection Inject 30 Units into the skin See admin instructions. Inject 30 units subcutaneously before breakfast and at bedtime    Historical Provider, MD  levothyroxine (SYNTHROID, LEVOTHROID) 25 MCG tablet Take 25 mcg by mouth daily at 6 (six) AM.     Historical Provider, MD  lisinopril (PRINIVIL,ZESTRIL) 40 MG tablet Take 40 mg by mouth at bedtime.  Historical Provider, MD  metoprolol (LOPRESSOR) 50 MG tablet Take 50 mg by mouth 2 (two) times daily.    Historical Provider, MD  Omega-3 Fatty Acids (FISH OIL) 1000 MG CAPS Take 2,000 mg by mouth at bedtime.    Historical Provider, MD  oxyCODONE (OXY IR/ROXICODONE) 5 MG immediate release tablet Take 1 tablet (5 mg total) by mouth every 6 (six) hours as needed for moderate pain (pain). 11/03/16   Doreatha Lew, MD  pravastatin (PRAVACHOL) 40 MG tablet Take 40 mg by mouth at bedtime.     Historical Provider, MD  senna-docusate (SENNA-S) 8.6-50 MG tablet Take 1  tablet by mouth 2 (two) times daily.    Historical Provider, MD  traMADol (ULTRAM) 50 MG tablet Take 1 tablet (50 mg total) by mouth every 6 (six) hours as needed (pain). 11/03/16   Doreatha Lew, MD  warfarin (COUMADIN) 1 MG tablet Take 1 mg by mouth See admin instructions. Take 1 tablet (1 mg) by mouth every other day at 5pm (alternating with 2 mg tablets)    Historical Provider, MD  warfarin (COUMADIN) 2 MG tablet Take 2 mg by mouth See admin instructions. Take 1 tablet (2 mg) by mouth every other day at 5pm (alternating with 1 mg tablet)    Historical Provider, MD    Physical Exam: Vitals:   01/12/17 0358 01/12/17 0754  BP: (!) 106/42   Pulse: 74   Resp: 18   Temp: 97.5 F (36.4 C) 97.8 F (36.6 C)  TempSrc: Oral Oral  SpO2: 100%   Weight: 90.4 kg (199 lb 4.7 oz)   Height: 5\' 3"  (1.6 m)       Constitutional: Lethargic, awakens and moaning in pain with palpation of abdomen; becomes quite agitated with any manipulation of right lower extremity Eyes: PERRL, lids mild periorbital edema and conjunctivae normal ENMT: Mucous membranes are dry. Posterior pharynx clear of any exudate or lesions. Neck: normal, supple, no masses, no thyromegaly Respiratory: Coarse to auscultation decreased in the bases bilaterally. Normal respiratory effort. No accessory muscle use. 2 L 99-100% Cardiovascular: Atrial fibrillation, no rubs / gallops. Grade 3/6 systolic murmur best her left sternal border fourth intercostal space nonradiating. No extremity edema. 2+ pedal pulses. No carotid bruits.  Abdomen: Mild generalized tenderness with more focal tenderness epigastric region with guarding no rebounding. No masses palpated. No hepatosplenomegaly. Bowel sounds positive.  Genitourinary: Foley catheter in place draining amber colored urine to bedside bag Musculoskeletal: no clubbing / cyanosis. No joint deformity upper and lower extremities. Good ROM upper extremities, no contractures. Abormal muscle  tone-appears to be developing lower extremity contractures.  Skin: no rashes, lesions. Wound VAC in place over large right lateral malleolus region wound periwound erythema/Tegaderm securing back as loose allowing for drainage of serosanguineous fluid. Patient also has scattered stage II decubitus in the sacral area most are small less than 2 cm with brain degrees of fibrin and necrotic fibrin in the base with a larger area of generalized erythema that appears to be related to moisture and possible candida Neurologic: CN 2-12 grossly intact based on general exam and visual inspection noting patient unable to follow simple commands consistently. Sensation intact, DTR normal. Unable to accurately test strength due to patient's inability to participate. Noted with stiffness and bilateral lower extremities uncertain if related to response to pain or if related to early contracture formation.  Psychiatric: Lethargic, answers questions minimally but overall appears very confused and drifts back to sleep rapidly  Labs on Admission: I have personally reviewed following labs and imaging studies  CBC: No results for input(s): WBC, NEUTROABS, HGB, HCT, MCV, PLT in the last 168 hours. Basic Metabolic Panel: No results for input(s): NA, K, CL, CO2, GLUCOSE, BUN, CREATININE, CALCIUM, MG, PHOS in the last 168 hours. GFR: CrCl cannot be calculated (Patient's most recent lab result is older than the maximum 21 days allowed.). Liver Function Tests: No results for input(s): AST, ALT, ALKPHOS, BILITOT, PROT, ALBUMIN in the last 168 hours. No results for input(s): LIPASE, AMYLASE in the last 168 hours. No results for input(s): AMMONIA in the last 168 hours. Coagulation Profile: No results for input(s): INR, PROTIME in the last 168 hours. Cardiac Enzymes: No results for input(s): CKTOTAL, CKMB, CKMBINDEX, TROPONINI in the last 168 hours. BNP (last 3 results) No results for input(s): PROBNP in the last 8760  hours. HbA1C: No results for input(s): HGBA1C in the last 72 hours. CBG: No results for input(s): GLUCAP in the last 168 hours. Lipid Profile: No results for input(s): CHOL, HDL, LDLCALC, TRIG, CHOLHDL, LDLDIRECT in the last 72 hours. Thyroid Function Tests: No results for input(s): TSH, T4TOTAL, FREET4, T3FREE, THYROIDAB in the last 72 hours. Anemia Panel: No results for input(s): VITAMINB12, FOLATE, FERRITIN, TIBC, IRON, RETICCTPCT in the last 72 hours. Urine analysis:    Component Value Date/Time   COLORURINE YELLOW 01/12/2017 0746   APPEARANCEUR HAZY (A) 01/12/2017 0746   LABSPEC 1.013 01/12/2017 0746   PHURINE 5.0 01/12/2017 0746   GLUCOSEU NEGATIVE 01/12/2017 0746   HGBUR NEGATIVE 01/12/2017 0746   BILIRUBINUR NEGATIVE 01/12/2017 0746   KETONESUR NEGATIVE 01/12/2017 0746   PROTEINUR NEGATIVE 01/12/2017 0746   NITRITE NEGATIVE 01/12/2017 0746   LEUKOCYTESUR MODERATE (A) 01/12/2017 0746   Sepsis Labs: @LABRCNTIP (procalcitonin:4,lacticidven:4) ) Recent Results (from the past 240 hour(s))  MRSA PCR Screening     Status: None   Collection Time: 01/12/17  3:57 AM  Result Value Ref Range Status   MRSA by PCR NEGATIVE NEGATIVE Final    Comment:        The GeneXpert MRSA Assay (FDA approved for NASAL specimens only), is one component of a comprehensive MRSA colonization surveillance program. It is not intended to diagnose MRSA infection nor to guide or monitor treatment for MRSA infections.      Radiological Exams on Admission: No results found.   Assessment/Plan Principal Problem:   AKI (acute kidney injury) on CKD (chronic kidney disease) stage 3, GFR 30-59 ml/min -Patient presents from skilled nursing facility with altered mentation in setting of significant azotemia. Patient was on both spironolactone/diuretics and ACE inhibitor prior to admission and this had reported failure to thrive symptoms with poor oral intake and altered mentation for at least 2  weeks. -Baseline renal function: 41/1.5 -Current renal function: 120/4.20 (after given 2.5 L at [place]) -Continue to hold offending medications -Continue IV fluids -Foley catheter for accurate I/O -Obtain CK -Was also hyperkalemic and received Kayexalate plus IV bicarbonate /calcium gluconate/ insulin and glucose at previous facility therefore repeat CMET now -Currently is making appropriate urine output so we'll continue to follow labs, monitor mentation, strict intake and output----if no improvement will need to consult nephrology to discuss whether dialysis would be appropriate for this elderly female patient with multiple medical comorbidities and significant functional decline since November 2017  Active Problems:   Metabolic encephalopathy -Likely uremic etiology given significantly elevated BUN at presentation -May have a degree of infectious etiology -Neuro checks every 2-4 hours -NPO until  more alert; may need swallowing evaluation      Decubitus ulcer of right leg, stage 2/history surgical repair right ankle fracture November 2017 -During December admission no documentation that patient had an open wound therefore this has developed since December admission -Continue wound VAC for now-WOC RN consulted -Recent outpatient treatment with Keflex for "wound infection". No culture results sent from nursing facility -Concern at this juncture that patient may have infected hardware/osteomyelitis and that this is not a "decubitus ulcer" -Obtain blood cultures -CT ankle-with h/o surgical repair suspect may have indwelling hardware that would be prohibitive to obtaining an MR -Empiric broad spectrum anbx's and narrow as appropriate -Pending CT results may benefit from orthopedic evaluation -Of note, patient has significant pain and tactile stimulation/manipulation of right lower extremity  **CT abnormal with associated large soft tissue wound around the distal fibula without CT evidence  of osteomyelitis. Unfortunately the patient's fixation hardware in portions of the fibula. Exposed to air. There is no soft tissue abscess. -Because of above CT we'll obtain arterial duplex to determine if any arterial insufficiency and will consult orthopedic surgery     SIRS (systemic inflammatory response syndrome)  -Suspect the majority of her SIRS physiology is related to dehydration but need to exclude infectious etiology -Initial urinalysis likely abnormal secondary to volume depletion so repeat catheterized urinalysis and culture **still abnormal/starting anbx's as above -Blood culture as above -Follow up on CT ankle to rule out osteomyelitis -Has low-grade leukocytosis without left shift but also recently on Macrobid and Keflex which may be blunting immunological response -Resp viral panel     Type 2 diabetes mellitus with long-term current use of insulin  -NPO so holding 70/30 insulin -SSI -Follow CBGs every 4 hours -HgbA1c    Essential hypertension -Current blood pressure suboptimal -Hold preadmission lisinopril (AKI) -Change home dose Lopressor to lower dose IV Lopressor 5 mg every 8 hours-primarily for rate control and patient with atrial fibrillation (below)    Chronic atrial fibrillation  -Currently rate controlled -Hold digoxin in setting of AKI and known elevated digoxin level -Repeat digoxin level -Pharmacy to manage warfarin when allowed PO intake- Lovenox for now -IV beta blocker as above    History of Clostridium difficile colitis -Was treated in December 2017 for C. difficile colitis with oral vancomycin -No reports of diarrhea but patient with abdominal pain on clinical exam and recent antibiotics so if develops loose stools/diarrhea will need to check C. difficile PCR -Given unexplained abdominal pain we'll obtain portable abdominal films     CHF NYHA class I, unspecified failure chronicity, unspecified type  -Appears compensated -Holding ACE inhibitor and  diuretics -ECHO December 2017:  LV function with EF 65-70% (hyperdynamic) with mild LVH, study not technically sufficient to evaluate for LV diastolic dysfunction due to atrial fibrillation, mild aortic stenosis, severely calcified mitral valve without regurgitation or stenosis, severe left atrial dilatation, mild right ventricular dilatation, severely dilated right atrium, mild-to-moderate tricuspid regurgitation, mild pulmonary hypertension 33 mmHg    Hypothyroidism, acquired -Continue Synthroid (use IV) at half oral dose -TSH mildly elevated in December so we'll repeat with free T4 and T3    COPD (chronic obstructive pulmonary disease)  -Asymptomatic -Duo nebs prn    Dyslipidemia -Hold preadmission statin until CK resulted -Hold TriCor    Sacral decubitus ulcer, stage II -Seems to be related to moisture -Routinely management/dressings per nursing protocol -WOC RN previously consulted for wound VAC as above -Air mattress      DVT prophylaxis: Lovenox per  pharmacy until can tolerate PO Code Status: DO NOT RESUSCITATE Family Communication: No family at bedside at time of admission Disposition Plan: Return to nursing home Consults called: Orthopedics/Dr. Juanda Bond ANP-BC [facility] Pager [phone]   If 7PM-7AM, please contact night-coverage [email or web address] Password [facility]  01/12/2017, 8:34 AM

## 2017-01-12 NOTE — Progress Notes (Addendum)
ANTICOAGULATION CONSULT NOTE - Initial Consult  Pharmacy Consult for  Lovenox when INR<2 (see addendum) Indication: atrial fibrillation  No Known Allergies  Patient Measurements: Height: 5\' 3"  (160 cm) Weight: 199 lb 4.7 oz (90.4 kg) IBW/kg (Calculated) : 52.4  Vital Signs: Temp: 97.8 F (36.6 C) (02/22 0754) Temp Source: Oral (02/22 0754) BP: 106/42 (02/22 0358) Pulse Rate: 74 (02/22 0358)  Labs:  Recent Labs  01/12/17 0745  HGB 10.3*  HCT 33.4*  PLT 288  LABPROT 25.3*  INR 2.26  CREATININE 3.87*  CKTOTAL 145    Estimated Creatinine Clearance: 13.2 mL/min (by C-G formula based on SCr of 3.87 mg/dL (H)).   Medical History: Past Medical History:  Diagnosis Date  . CHF (congestive heart failure) ([place])   . Chronic kidney disease   . Diabetes mellitus without complication ([place])   . Dysrhythmia   . Hypertension   . Hypothyroidism     Assessment: 17 YOF on warfarin PTA for hx Afib. Admit INR 2.26. Per review of nursing home records - it appears the patient's most recent dose was 2 mg daily however some doses had been held recently. Hgb 10.3, plts wnl. No bleeding noted. Will start with a lower warfarin dose to evaluate response given unclear data on most recent dosing history.  Goal of Therapy:  INR 2-3 Monitor platelets by anticoagulation protocol: Yes   Plan:  1. Warfarin 1 mg x 1 dose at 1800 today 2. Will continue to monitor for any signs/symptoms of bleeding and will follow up with PT/INR in the a.m.   Thank you for allowing pharmacy to be a part of this patient's care.  Alycia Rossetti, PharmD, BCPS Clinical Pharmacist Pager: [phone] Clinical phone for 01/12/2017 from 7a-3:30p: [phone] If after 3:30p, please call main pharmacy at: [phone] 01/12/2017 1:16 PM    ------------------------------------------------------------------------------------------------ Addendum:  Noticed patient's NPO status and no speech eval yet. Discussed with Erin Hearing,  NP and plans are to start full dose lovenox to bridge once the patient's INR<2. No need to start lovenox today. Will f/u with an INR check in the AM.  Plan 1. Hold warfarin while NPO 2. Will plan to start Lovenox when INR<2 3. Daily INR  Thank you for allowing pharmacy to be a part of this patient's care.  Alycia Rossetti, PharmD, BCPS Clinical Pharmacist Pager: [phone] Clinical phone for 01/12/2017 from 7a-3:30p: [phone] If after 3:30p, please call main pharmacy at: [phone] 01/12/2017 1:21 PM

## 2017-01-13 ENCOUNTER — Inpatient Hospital Stay (HOSPITAL_COMMUNITY): Payer: Medicare Other

## 2017-01-13 DIAGNOSIS — I482 Chronic atrial fibrillation: Secondary | ICD-10-CM

## 2017-01-13 DIAGNOSIS — S91009A Unspecified open wound, unspecified ankle, initial encounter: Secondary | ICD-10-CM

## 2017-01-13 DIAGNOSIS — M869 Osteomyelitis, unspecified: Secondary | ICD-10-CM

## 2017-01-13 DIAGNOSIS — I422 Other hypertrophic cardiomyopathy: Secondary | ICD-10-CM

## 2017-01-13 DIAGNOSIS — J431 Panlobular emphysema: Secondary | ICD-10-CM

## 2017-01-13 LAB — COMPREHENSIVE METABOLIC PANEL
ALBUMIN: 1.8 g/dL — AB (ref 3.5–5.0)
ALT: 18 U/L (ref 14–54)
ANION GAP: 8 (ref 5–15)
AST: 56 U/L — ABNORMAL HIGH (ref 15–41)
Alkaline Phosphatase: 73 U/L (ref 38–126)
BUN: 93 mg/dL — ABNORMAL HIGH (ref 6–20)
CO2: 24 mmol/L (ref 22–32)
Calcium: 8.1 mg/dL — ABNORMAL LOW (ref 8.9–10.3)
Chloride: 117 mmol/L — ABNORMAL HIGH (ref 101–111)
Creatinine, Ser: 2.71 mg/dL — ABNORMAL HIGH (ref 0.44–1.00)
GFR calc Af Amer: 19 mL/min — ABNORMAL LOW (ref >60)
GFR calc non Af Amer: 16 mL/min — ABNORMAL LOW (ref >60)
GLUCOSE: 103 mg/dL — AB (ref 65–99)
POTASSIUM: 5.5 mmol/L — AB (ref 3.5–5.1)
SODIUM: 149 mmol/L — AB (ref 135–145)
Total Bilirubin: 1.3 mg/dL — ABNORMAL HIGH (ref 0.3–1.2)
Total Protein: 5.7 g/dL — ABNORMAL LOW (ref 6.5–8.1)

## 2017-01-13 LAB — PROTIME-INR
INR: 2.26
Prothrombin Time: 25.3 seconds — ABNORMAL HIGH (ref 11.4–15.2)

## 2017-01-13 LAB — T4, FREE: Free T4: 1.3 ng/dL — ABNORMAL HIGH (ref 0.61–1.12)

## 2017-01-13 LAB — HEMOGLOBIN A1C
HEMOGLOBIN A1C: 5.6 % (ref 4.8–5.6)
Mean Plasma Glucose: 114

## 2017-01-13 LAB — GLUCOSE, CAPILLARY
GLUCOSE-CAPILLARY: 90 mg/dL (ref 65–99)
GLUCOSE-CAPILLARY: 132 mg/dL — AB (ref 65–99)
Glucose-Capillary: 129 mg/dL — ABNORMAL HIGH (ref 65–99)
Glucose-Capillary: 86 mg/dL (ref 65–99)
Glucose-Capillary: 90 mg/dL (ref 65–99)
Glucose-Capillary: 92 mg/dL (ref 65–99)
Glucose-Capillary: 93 mg/dL (ref 65–99)

## 2017-01-13 LAB — CBC
HEMATOCRIT: 38 % (ref 36.0–46.0)
HEMOGLOBIN: 11.1 g/dL — AB (ref 12.0–15.0)
MCH: 29.2 pg (ref 26.0–34.0)
MCHC: 29.2 g/dL — AB (ref 30.0–36.0)
MCV: 100 fL (ref 78.0–100.0)
Platelets: 186 10*3/uL (ref 150–400)
RBC: 3.8 MIL/uL — ABNORMAL LOW (ref 3.87–5.11)
RDW: 18.2 % — ABNORMAL HIGH (ref 11.5–15.5)
WBC: 9.4 10*3/uL (ref 4.0–10.5)

## 2017-01-13 MED ORDER — ORAL CARE MOUTH RINSE
15.0000 mL | Freq: Two times a day (BID) | OROMUCOSAL | Status: DC
Start: 2017-01-13 — End: 2017-01-16
  Administered 2017-01-13 – 2017-01-16 (×7): 15 mL via OROMUCOSAL

## 2017-01-13 MED ORDER — DEXTROSE-NACL 5-0.9 % IV SOLN
INTRAVENOUS | Status: DC
  Administered 2017-01-13 – 2017-01-16 (×6): via INTRAVENOUS

## 2017-01-13 NOTE — Progress Notes (Signed)
ANTICOAGULATION CONSULT NOTE - Initial Consult  Pharmacy Consult for Lovenox when INR<2 (see addendum) Indication: atrial fibrillation  No Known Allergies  Patient Measurements: Height: 5\' 3"  (160 cm) Weight: 198 lb 10.2 oz (90.1 kg) IBW/kg (Calculated) : 52.4  Vital Signs: Temp: 97.5 F (36.4 C) (02/23 0826) Temp Source: Oral (02/23 0826) BP: 108/56 (02/23 0826) Pulse Rate: 68 (02/23 0826)  Labs:  Recent Labs  01/12/17 0745 01/13/17 1148  HGB 10.3* 11.1*  HCT 33.4* 38.0  PLT 288 186  LABPROT 25.3* 25.3*  INR 2.26 2.26  CREATININE 3.87*  --   CKTOTAL 145  --     Estimated Creatinine Clearance: 13.2 mL/min (by C-G formula based on SCr of 3.87 mg/dL (H)).   Medical History: Past Medical History:  Diagnosis Date  . CHF (congestive heart failure) ([place])   . Chronic kidney disease   . Diabetes mellitus without complication ([place])   . Dysrhythmia   . Hypertension   . Hypothyroidism     Assessment: 23 YOF on warfarin 2mg  daily PTA for hx Afib. Did have some doses held recently. INR 2.26 on admit and remains there. NPO - no SLP eval yet. Discussed with TRH - to start Enox when INR < 2. F/u AM INR  Goal of Therapy:  INR 2-3 Monitor platelets by anticoagulation protocol: Yes   Plan:  Continue to hold Coumadin while NPO Start Lovenox when INR < 2  Elenor Quinones, PharmD, [facility] Clinical Pharmacist Pager [phone] 01/13/2017 12:34 PM

## 2017-01-13 NOTE — Care Management Note (Addendum)
Case Management Note  Patient Details  Name: Danielle Woodard MRN: [id number] Date of Birth: [birth year]  Subjective/Objective:  Pt is from [facility], family is considering holding bed for her.  Eddie Candle [phone]) is [facility] POA - questioned whether hospital had documentation on file.  Per check of record, documentation is not on file.  She is calling [facility] and requesting they fax paperwork for pt's chart.              Expected Discharge Plan:  [place]  In-House Referral:  Clinical Social Work  Discharge planning Services  CM Consult  Status of Service:  Completed, signed off  Girard Cooter, [place] 01/13/2017, 2:16 PM

## 2017-01-13 NOTE — Progress Notes (Signed)
VASCULAR LAB PRELIMINARY  ARTERIAL  ABI completed: Bilateral ABI non compressible. Bilateral TBI abnormal.    RIGHT    LEFT    PRESSURE WAVEFORM  PRESSURE WAVEFORM  BRACHIAL 126 Tri BRACHIAL N/A IV site   DP 255 Bi DP 255 Mono         PT 255 Mono PT 241 Mono         GREAT TOE 36 abnormal GREAT TOE 16 abnormal    RIGHT LEFT  TBI 0.29 0.13  ABI [place] [place]    Landry Mellow, RDMS, RVT   01/13/2017, 12:18 PM
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
## 2017-01-14 DIAGNOSIS — I422 Other hypertrophic cardiomyopathy: Secondary | ICD-10-CM

## 2017-01-14 DIAGNOSIS — N390 Urinary tract infection, site not specified: Secondary | ICD-10-CM

## 2017-01-14 DIAGNOSIS — A419 Sepsis, unspecified organism: Secondary | ICD-10-CM

## 2017-01-14 DIAGNOSIS — J431 Panlobular emphysema: Secondary | ICD-10-CM

## 2017-01-14 DIAGNOSIS — M86161 Other acute osteomyelitis, right tibia and fibula: Secondary | ICD-10-CM

## 2017-01-14 DIAGNOSIS — N183 Chronic kidney disease, stage 3 unspecified: Secondary | ICD-10-CM

## 2017-01-14 DIAGNOSIS — M869 Osteomyelitis, unspecified: Secondary | ICD-10-CM

## 2017-01-14 DIAGNOSIS — E118 Type 2 diabetes mellitus with unspecified complications: Secondary | ICD-10-CM

## 2017-01-14 DIAGNOSIS — N170 Acute kidney failure with tubular necrosis: Secondary | ICD-10-CM

## 2017-01-14 LAB — GLUCOSE, CAPILLARY
GLUCOSE-CAPILLARY: 117 mg/dL — AB (ref 65–99)
GLUCOSE-CAPILLARY: 162 mg/dL — AB (ref 65–99)
Glucose-Capillary: 115 mg/dL — ABNORMAL HIGH (ref 65–99)
Glucose-Capillary: 177 mg/dL — ABNORMAL HIGH (ref 65–99)
Glucose-Capillary: 169 mg/dL — ABNORMAL HIGH (ref 65–99)

## 2017-01-14 LAB — BASIC METABOLIC PANEL
ANION GAP: 7 (ref 5–15)
BUN: 80 mg/dL — ABNORMAL HIGH (ref 6–20)
CALCIUM: 8 mg/dL — AB (ref 8.9–10.3)
CO2: 23 mmol/L (ref 22–32)
CREATININE: 2.13 mg/dL — AB (ref 0.44–1.00)
Chloride: 117 mmol/L — ABNORMAL HIGH (ref 101–111)
GFR, EST AFRICAN AMERICAN: 25 mL/min — AB (ref >60)
GFR, EST NON AFRICAN AMERICAN: 21 mL/min — AB (ref >60)
GLUCOSE: 111 mg/dL — AB (ref 65–99)
Potassium: 5.2 mmol/L — ABNORMAL HIGH (ref 3.5–5.1)
Sodium: 147 mmol/L — ABNORMAL HIGH (ref 135–145)

## 2017-01-14 LAB — PROTIME-INR
INR: 2.05
PROTHROMBIN TIME: 23.5 s — AB (ref 11.4–15.2)

## 2017-01-14 LAB — T3: T3, Total: 61 ng/dL — ABNORMAL LOW (ref 71–180)

## 2017-01-14 LAB — URINE CULTURE: Culture: 70000 — AB

## 2017-01-14 MED ORDER — ENOXAPARIN SODIUM 100 MG/ML ~~LOC~~ SOLN
1.0000 mg/kg | Freq: Every day | SUBCUTANEOUS | Status: DC
  Administered 2017-01-14: 100 mg via SUBCUTANEOUS
  Filled 2017-01-14: qty 1

## 2017-01-14 MED ORDER — VANCOMYCIN HCL IN DEXTROSE 1-5 GM/200ML-% IV SOLN
1000.0000 mg | INTRAVENOUS | Status: DC
  Administered 2017-01-14: 1000 mg via INTRAVENOUS
  Filled 2017-01-14 (×2): qty 200

## 2017-01-14 NOTE — Progress Notes (Signed)
ANTICOAGULATION CONSULT NOTE - Follow Up Consult  Pharmacy Consult for Lovenox Indication: atrial fibrillation  No Known Allergies  Patient Measurements: Height: 5\' 3"  (160 cm) Weight: 216 lb 9.6 oz (98.2 kg) IBW/kg (Calculated) : 52.4  Vital Signs: Temp: 97.6 F (36.4 C) (02/24 0800) Temp Source: Oral (02/24 0800) BP: 102/44 (02/24 0800) Pulse Rate: 59 (02/24 0800)  Labs:  Recent Labs  01/12/17 0745 01/13/17 1148 01/14/17 0233  HGB 10.3* 11.1*  --   HCT 33.4* 38.0  --   PLT 288 186  --   LABPROT 25.3* 25.3* 23.5*  INR 2.26 2.26 2.05  CREATININE 3.87* 2.71* 2.13*  CKTOTAL 145  --   --     Estimated Creatinine Clearance: 25.1 mL/min (by C-G formula based on SCr of 2.13 mg/dL (H)).   Medical History: Past Medical History:  Diagnosis Date  . CHF (congestive heart failure) ([place])   . Chronic kidney disease   . Diabetes mellitus without complication ([place])   . Dysrhythmia   . Hypertension   . Hypothyroidism     Assessment: 19 YOF on warfarin 2mg  daily PTA for h/o Afib. INR 2.26 on admit, down to 2.05 this AM. Pharmacy consulted to start Lovenox once INR < 2. CBC stable, no bleeding noted, CrCl ~ 25 ml/min  Goal of Therapy:  Monitor platelets by anticoagulation protocol: Yes   Plan:  Start Lovenox 1 mg/kg (100 mg) SQ once daily tonight Monitor renal function, CBC, s/sx of bleeding Daily INR F/u ability to resume warfarin   Gwenlyn Perking, PharmD PGY1 Pharmacy Resident Pager: [phone] 01/14/2017 9:29 AM

## 2017-01-14 NOTE — Progress Notes (Addendum)
Pharmacy Antibiotic Note  Danielle Woodard is a 76 y.o. female admitted on 01/12/2017 with AMS concerning for sepsis of possible urinary or wound source. Pharmacy has been consulted for Vancomycin + Cefepime dosing. Today is day 3 of empiric antibiotics. WBC down 9.4, afebrile, CrCl improved to ~25 ml/min  Patient received vancomycin 1500 mg load on 2/22  Plan: Vancomycin 1000 mg IV Q24H Cefepime 1g IV Q24H Monitor renal function, LOT, and antibiotic de-escalation plans  Check vanc trough at steady state  Height: 5\' 3"  (160 cm) Weight: 216 lb 9.6 oz (98.2 kg) IBW/kg (Calculated) : 52.4  Temp (24hrs), Avg:97.6 F (36.4 C), Min:97.4 F (36.3 C), Max:97.7 F (36.5 C)   Recent Labs Lab 01/12/17 0745 01/13/17 1148 01/14/17 0233  WBC 10.9* 9.4  --   CREATININE 3.87* 2.71* 2.13*    Estimated Creatinine Clearance: 25.1 mL/min (by C-G formula based on SCr of 2.13 mg/dL (H)).    No Known Allergies  Antimicrobials this admission: Ceftriaxone 2/21 x 1 @ [facility] 2/22 >> Cefepime 2/22 >>  Microbiology results: 2/22 BCx >> ngtd 2/22 MRSA PCR >> neg 2/22 RVP >> neg 2/22 UCx >> 70K colonies Enterococcus (S-vanco only)  Thank you for allowing pharmacy to be a part of this patient's care.  Gwenlyn Perking, PharmD PGY1 Pharmacy Resident Pager: [phone] 01/14/2017 10:03 AM

## 2017-01-14 NOTE — Evaluation (Signed)
Clinical/Bedside Swallow Evaluation Patient Details  Name: Danielle Woodard MRN: [id number] Date of Birth: 12-07-1940  Today's Date: 01/14/2017 Time: SLP Start Time (ACUTE ONLY): 0932 SLP Stop Time (ACUTE ONLY): 0951 SLP Time Calculation (min) (ACUTE ONLY): 19 min  Past Medical History:  Past Medical History:  Diagnosis Date  . CHF (congestive heart failure) ([place])   . Chronic kidney disease   . Diabetes mellitus without complication ([place])   . Dysrhythmia   . Hypertension   . Hypothyroidism    Past Surgical History: No past surgical history on file. HPI:  76 y.o. female who presents as a transfer from outside facility due to possible need for dialysis as renal funciton deteriorating and now w/ encehpalopathy likely from uremia and now with AKI w/ CKD likely in part from dehydration RLE wound w/ wound vac and sacral decubitus ulcerations; BSE ordered to r/o aspiration/dysphagia d/t chronic postprandial cough noted in PMHx.   Assessment / Plan / Recommendation Clinical Impression   Pt with delayed cough after all consistencies administered during BSE; but otherwise, no overt s/s of aspiration noted during BSE; pt appears with normal oropharyngeal swallow; esophageal assessment may be considered to r/o esophageal component re: swallow function; ST will f/u for diet tolerance x1 to assess swallowing endurance during meal intake; Recommend Soft diet/thin liquids. SLP Visit Diagnosis: Dysphagia, unspecified (R13.10)    Aspiration Risk  No limitations    Diet Recommendation  Soft diet/thin liquids  Medication Administration: Whole meds with liquid    Other  Recommendations Recommended Consults: Consider esophageal assessment Oral Care Recommendations: Oral care BID   Follow up Recommendations None      Frequency and Duration min 1 x/week  1 week       Prognosis Prognosis for Safe Diet Advancement: Good      Swallow Study   General Date of Onset: 01/12/17 HPI: 76 y.o. female  who presents as a transfer from outside facility due to possible need for dialysis as renal funciton deteriorating and now w/ encehpalopathy likely from uremia and now with AKI w/ CKD likely in part from dehydration RLE wound w/ wound vac and sacral decubitus ulcerations Type of Study: Bedside Swallow Evaluation Previous Swallow Assessment:  (Postprandial cough noted (chronic); BSE ordered) Diet Prior to this Study: Dysphagia 3 (soft);Thin liquids Temperature Spikes Noted: No Respiratory Status: Room air History of Recent Intubation: No Behavior/Cognition: Alert;Cooperative;Pleasant mood Oral Cavity Assessment: Within Functional Limits Oral Care Completed by SLP: No Oral Cavity - Dentition: Edentulous;Other (Comment) (wears dentures; not available) Vision: Functional for self-feeding Self-Feeding Abilities: Able to feed self Patient Positioning: Upright in bed Baseline Vocal Quality: Normal Volitional Cough: Strong Volitional Swallow: Able to elicit    Oral/Motor/Sensory Function Overall Oral Motor/Sensory Function: Within functional limits   Ice Chips Ice chips: Within functional limits Presentation: Spoon   Thin Liquid Thin Liquid: Within functional limits Presentation: Cup;Straw    Nectar Thick Nectar Thick Liquid: Not tested   Honey Thick Honey Thick Liquid: Not tested   Puree Puree: Within functional limits Presentation: Spoon   Solid      Solid: Within functional limits Presentation: Spoon Other Comments:  (noted delayed cough at end of BSE after all consistencies )    Functional Assessment Tool Used: NOMS; clinical judgment Functional Limitations: Swallowing Swallow Current Status [id number]): At least 1 percent but less than 20 percent impaired, limited or restricted Swallow Goal Status [phone]): At least 1 percent but less than 20 percent impaired, limited or restricted   Toshua Honsinger,PAT, M.S.,  CCC-SLP 01/14/2017,10:05 AM

## 2017-01-14 NOTE — Progress Notes (Addendum)
PROGRESS NOTE    Danielle Woodard  [id number] DOB: Mar 13, 1941 DOA: 01/12/2017 PCP: No primary care provider on file.   Brief Narrative:  77 y.o. WF PMHx CKD, DM type II uncontrolled with complications, HTN, Chronic Atrial fibrillation on warfarin, Hypothyroidism, COPD, Dilated Cardiomyopathy, Pulmonary Hypertension  She was hospitalized in December 2017 secondary to acute kidney injury/volume depletion/sepsis secondary to enterococcus UTI and C. difficile colitis. S/P surgical repair of RIGHT ankle fracture in late November with no documented wound during previous admission in December. She was subsequently discharged to skilled nursing facility. In January she was sent to the ER for epistaxis related to supratherapeutic INR. She has also had at least one ER visit for hypoxemia presumably related to volume overload and was given a dose of IV Lasix in the ER and discharged back to the facility.  On 2/21 patient was sent to the [facility] ER from the facility secondary to altered mental status and abnormal lab values. This documented that she had again" acute blood loss anemia and abnormal coagulopathy, PA notified of acute renal failure on labs". According to documentation that was sent with the patient from [facility] patient has had progressive altered mentation for 2 weeks with lethargy and poor oral intake with mentation worse over the past 48 hours. Reviewing the medication records reveals patient has been receiving Macrobid and Keflex with last dose due today on 2/22. This was given to treat presumed UTI as well as right lower extremity wound infection. She apparently has had flulike symptoms in the past several weeks with a course of Tamiflu given at the end of January and a 1 day course given on 2/12 with the presumption that her rapid influenza PCR screen was negative thus prompting discontinuation of that medication.  In the ER patient did have a significant acute kidney  injury with a creatinine greater than 4 and a BUN greater than 120 with a potassium of 6.3. She was given fluids, cardioprotective medications and a dose of Kayexalate. It was mentioned that family was considering the possibility of hemodialysis that the patient was subsequently transferred to [facility] for further evaluation and treatment.   Subjective: 2/24  A/O 4, encephalopathy appears to be clearing. States would like to try and sit in the chair today..      Assessment & Plan:   Principal Problem:   AKI (acute kidney injury) (Danielle Woodard) Active Problems:   CHF NYHA class I, unspecified failure chronicity, unspecified type (Danielle Woodard)   Metabolic encephalopathy   Type 2 diabetes mellitus with stage 3 chronic kidney disease, with long-term current use of insulin (HCC)   Essential hypertension   Chronic atrial fibrillation ([facility])   Hypothyroidism, acquired   CKD (chronic kidney disease) stage 3, GFR 30-59 ml/min   Decubitus ulcer of right leg, stage 2   COPD (chronic obstructive pulmonary disease) (HCC)   History of Clostridium difficile colitis   SIRS (systemic inflammatory response syndrome) (HCC)   Sacral decubitus ulcer, stage II   Open ankle wound   Diabetes mellitus with complication ([facility])   Acute on CKD (chronic kidney disease) stage 3,(Baseline BUN/Cr 41/1.5) -Patient presents from skilled nursing facility with altered mentation in setting of significant azotemia. Patient was on both spironolactone/diuretics and ACE inhibitor prior to admission and this had reported failure to thrive symptoms with poor oral intake and altered mentation for at least 2 weeks. -Per admission note BUN/Cr peaked 120/4.20 (after given 2.5 L at [place]) -Continue to hold offending medications -D5-0.9% saline  100 ml/hr Lab Results  Component Value Date   CREATININE 2.13 (H) 01/14/2017   CREATININE 2.71 (H) 01/13/2017   CREATININE 3.87 (H) 01/12/2017  - Cr continues to improve with aggressive  hydration  Positive Enterococcus Faecium (ESBL) UTI/Sepsis UTI -Discussed case with ID. Recommended continuing vancomycin until discharge on Monday, and then stopping all antibiotics in order for orthopedic surgeons to obtain better biopsy i.e. off antibiotics.  Metabolic encephalopathy -Likely uremic etiology given significantly elevated BUN at presentation -May have a degree of infectious etiology -Improving  Osteomyelitis Right Lower Leg,/S/P surgical repair right ankle fracture November 2017 -During December admission no documentation that patient had an open wound therefore this has developed since December admission -Continue wound VAC for now-WOC RN consulted -By definition exposure of hardware patient has osteomyelitis,Obtain blood cultures -Continue current antibiotics. -Orthopedic surgery. Recommendations: Hardware should be removed at earliest possible time by original surgeon. -Spoke at length with daughter and son concerning necessity for long-term antibiotics,  -Since we will DC antibiotics at discharge will hold on placing PICC line. Restarting antibiotics per orthopedic surgeon. -Prior to discharge schedule follow-up appointment with patient orthopedic surgeon  Type 2 diabetes mellitus controlled with complication  -NPO so holding 70/30 insulin -Sensitive SSI -2/22 Hemoglobin A1c= 5.6  Other Hypertrophic Cardiomyopathy   -Appears compensated -Holding ACE inhibitor and diuretics -ECHO December 2017:  LV function with EF 65-70% (hyperdynamic) with mild LVH, study not technically sufficient to evaluate for LV diastolic dysfunction due to atrial fibrillation, mild aortic stenosis, severely calcified mitral valve without regurgitation or stenosis, severe left atrial dilatation, mild right ventricular dilatation, severely dilated right atrium, mild-to-moderate tricuspid regurgitation, mild pulmonary hypertension 33 mmHg -Strict I&O since admission +1.6 L -Daily weight Filed  Weights   01/12/17 0358 01/13/17 0500 01/14/17 0457  Weight: 90.4 kg (199 lb 4.7 oz) 90.1 kg (198 lb 10.2 oz) 98.2 kg (216 lb 9.6 oz)      Essential hypertension -Patient's BP still on the low side but improved. Continue hydration  -Hold BP medication. -Patient currently rate controlled if rate becomes an issue will restart a rate control agent.    Chronic atrial fibrillation  -Currently rate controlled -Hold digoxin in setting of AKI and known elevated digoxin level -Pharmacy to manage warfarin when allowed PO intake- Lovenox for now    History of Clostridium difficile colitis -Was treated in December 2017 for C. difficile colitis with oral vancomycin -Currently no signs or symptoms of C. difficile.     Hypothyroidism, acquired -Continue Synthroid (use IV) at half oral dose -TSH mildly elevated in December so we'll repeat with free T4 and T3 -TSH/free T4/T3 most consistent with sick euthyroid syndrome.    COPD (chronic obstructive pulmonary disease)  -Asymptomatic -Duo nebs prn    Dyslipidemia -Hold preadmission statin until CK resulted -Hold TriCor    Sacral decubitus ulcer, stage II -Seems to be related to moisture -Routinely management/dressings per nursing protocol -WOC RN previously consulted for wound VAC as above -Air mattress  Aspiration chronic -Counseled patient family warned dangers of aspirating. Family aware that aspiration could cause additional infection/DEATH. Request patient be allowed to eat and drink until we can evaluate.    DVT prophylaxis: Lovenox Code Status: DO NOT RESUSCITATE Family Communication: None  Disposition Plan: Return to SNF     Consultants:  [facility] Phone Consult with Dr. Scharlene Gloss ID   Procedures/Significant Events:  2/22 CT RIGHT ankle:Large soft tissue wound about the distal fibula. Although no CT  evidence of osteomyelitis is identified, the patient's fixation hardware and  portions of the fibula appear exposed to air. -Fracture fixation hardware in the distal fibula is intact without evidence of loosening.      VENTILATOR SETTINGS:    Cultures 2/20 urine positive Enterococcus Faecium (ESBL) 2/22 blood 2 NGTD 2/22 respiratory virus panel negative     Antimicrobials: Anti-infectives    Start     Stop   01/14/17 1000  vancomycin (VANCOCIN) IVPB 1000 mg/200 mL premix         01/12/17 1100  vancomycin (VANCOCIN) 1,500 mg in sodium chloride 0.9 % 500 mL IVPB     01/12/17 1329   01/12/17 1030  ceFEPIme (MAXIPIME) 1 g in dextrose 5 % 50 mL IVPB             Devices    LINES / TUBES:      Continuous Infusions: . dextrose 5 % and 0.9% NaCl 100 mL/hr at 01/14/17 0457     Objective: Vitals:   01/14/17 0340 01/14/17 0400 01/14/17 0457 01/14/17 0800  BP: (!) 92/41 (!) 126/56  (!) 102/44  Pulse: 72   (!) 59  Resp: 14   (!) 0  Temp: 97.7 F (36.5 C)   97.6 F (36.4 C)  TempSrc: Oral   Oral  SpO2: 100%   100%  Weight:   98.2 kg (216 lb 9.6 oz)   Height:        Intake/Output Summary (Last 24 hours) at 01/14/17 0951 Last data filed at 01/14/17 0600  Gross per 24 hour  Intake          1071.67 ml  Output             1075 ml  Net            -3.33 ml   Filed Weights   01/12/17 0358 01/13/17 0500 01/14/17 0457  Weight: 90.4 kg (199 lb 4.7 oz) 90.1 kg (198 lb 10.2 oz) 98.2 kg (216 lb 9.6 oz)    Examination:  General:A/O 4, No acute respiratory distress Eyes: negative scleral hemorrhage, negative anisocoria, negative icterus ENT: Negative Runny nose, negative gingival bleeding, Neck:  Negative scars, masses, torticollis, lymphadenopathy, JVD Lungs: Clear to auscultation bilaterally without wheezes or crackles Cardiovascular: Irregular irregular rhythm and rate, without murmur gallop or rub normal S1 and S2 Abdomen: negative abdominal pain, nondistended, positive soft, bowel sounds, no rebound, no ascites, no appreciable  mass Extremities: right ankle with wound VAC in place. Warm to the touch, DP pulse one plus  Psychiatric:  Negative depression, negative anxiety, negative fatigue, negative mania  Central nervous system:  Cranial nerves II through XII intact, tongue/uvula midline, all extremities muscle strength 5/5, sensation intact throughout,  negative dysarthria, negative expressive aphasia, negative receptive aphasia.  .     Data Reviewed: Care during the described time interval was provided by me .  I have reviewed this patient's available data, including medical history, events of note, physical examination, and all test results as part of my evaluation. I have personally reviewed and interpreted all radiology studies.  CBC:  Recent Labs Lab 01/12/17 0745 01/13/17 1148  WBC 10.9* 9.4  NEUTROABS 7.1  --   HGB 10.3* 11.1*  HCT 33.4* 38.0  MCV 97.7 100.0  PLT 288 [id number]   Basic Metabolic Panel:  Recent Labs Lab 01/12/17 0745 01/13/17 1148 01/14/17 0233  NA 145 149* 147*  K 6.2* 5.5* 5.2*  CL 110 117* 117*  CO2 25  24 23  GLUCOSE 101* 103* 111*  BUN 109* 93* 80*  CREATININE 3.87* 2.71* 2.13*  CALCIUM 7.7* 8.1* 8.0*   GFR: Estimated Creatinine Clearance: 25.1 mL/min (by C-G formula based on SCr of 2.13 mg/dL (H)). Liver Function Tests:  Recent Labs Lab 01/12/17 0745 01/13/17 1148  AST 45* 56*  ALT 13* 18  ALKPHOS 63 73  BILITOT 1.2 1.3*  PROT 5.0* 5.7*  ALBUMIN 1.7* 1.8*   No results for input(s): LIPASE, AMYLASE in the last 168 hours. No results for input(s): AMMONIA in the last 168 hours. Coagulation Profile:  Recent Labs Lab 01/12/17 0745 01/13/17 1148 01/14/17 0233  INR 2.26 2.26 2.05   Cardiac Enzymes:  Recent Labs Lab 01/12/17 0745  CKTOTAL 145   BNP (last 3 results) No results for input(s): PROBNP in the last 8760 hours. HbA1C:  Recent Labs  01/12/17 0745  HGBA1C 5.6   CBG:  Recent Labs Lab 01/13/17 1657 01/13/17 2119 01/13/17 2323  01/14/17 0347 01/14/17 0820  GLUCAP 93 132* 129* 115* 117*   Lipid Profile: No results for input(s): CHOL, HDL, LDLCALC, TRIG, CHOLHDL, LDLDIRECT in the last 72 hours. Thyroid Function Tests:  Recent Labs  01/12/17 0745 01/13/17 1148  TSH 1.840  --   FREET4  --  1.30*   Anemia Panel: No results for input(s): VITAMINB12, FOLATE, FERRITIN, TIBC, IRON, RETICCTPCT in the last 72 hours. Urine analysis:    Component Value Date/Time   COLORURINE YELLOW 01/12/2017 0746   APPEARANCEUR HAZY (A) 01/12/2017 0746   LABSPEC 1.013 01/12/2017 0746   PHURINE 5.0 01/12/2017 0746   GLUCOSEU NEGATIVE 01/12/2017 0746   HGBUR NEGATIVE 01/12/2017 0746   BILIRUBINUR NEGATIVE 01/12/2017 0746   KETONESUR NEGATIVE 01/12/2017 0746   PROTEINUR NEGATIVE 01/12/2017 0746   NITRITE NEGATIVE 01/12/2017 0746   LEUKOCYTESUR MODERATE (A) 01/12/2017 0746   Sepsis Labs: @LABRCNTIP (procalcitonin:4,lacticidven:4)  ) Recent Results (from the past 240 hour(s))  MRSA PCR Screening     Status: None   Collection Time: 01/12/17  3:57 AM  Result Value Ref Range Status   MRSA by PCR NEGATIVE NEGATIVE Final    Comment:        The GeneXpert MRSA Assay (FDA approved for NASAL specimens only), is one component of a comprehensive MRSA colonization surveillance program. It is not intended to diagnose MRSA infection nor to guide or monitor treatment for MRSA infections.   Urine culture     Status: Abnormal   Collection Time: 01/12/17  7:46 AM  Result Value Ref Range Status   Specimen Description URINE, RANDOM  Final   Special Requests NONE  Final   Culture 70,000 COLONIES/mL ENTEROCOCCUS FAECIUM (A)  Final   Report Status 01/14/2017 FINAL  Final   Organism ID, Bacteria ENTEROCOCCUS FAECIUM (A)  Final      Susceptibility   Enterococcus faecium - MIC*    AMPICILLIN >=32 RESISTANT Resistant     LEVOFLOXACIN >=8 RESISTANT Resistant     NITROFURANTOIN 128 RESISTANT Resistant     VANCOMYCIN <=0.5 SENSITIVE  Sensitive     * 70,000 COLONIES/mL ENTEROCOCCUS FAECIUM  Respiratory Panel by PCR     Status: None   Collection Time: 01/12/17  8:51 AM  Result Value Ref Range Status   Adenovirus NOT DETECTED NOT DETECTED Final   Coronavirus 229E NOT DETECTED NOT DETECTED Final   Coronavirus HKU1 NOT DETECTED NOT DETECTED Final   Coronavirus NL63 NOT DETECTED NOT DETECTED Final   Coronavirus OC43 NOT DETECTED NOT DETECTED Final  Metapneumovirus NOT DETECTED NOT DETECTED Final   Rhinovirus / Enterovirus NOT DETECTED NOT DETECTED Final   Influenza A NOT DETECTED NOT DETECTED Final   Influenza B NOT DETECTED NOT DETECTED Final   Parainfluenza Virus 1 NOT DETECTED NOT DETECTED Final   Parainfluenza Virus 2 NOT DETECTED NOT DETECTED Final   Parainfluenza Virus 3 NOT DETECTED NOT DETECTED Final   Parainfluenza Virus 4 NOT DETECTED NOT DETECTED Final   Respiratory Syncytial Virus NOT DETECTED NOT DETECTED Final   Bordetella pertussis NOT DETECTED NOT DETECTED Final   Chlamydophila pneumoniae NOT DETECTED NOT DETECTED Final   Mycoplasma pneumoniae NOT DETECTED NOT DETECTED Final  Culture, blood (Routine X 2) w Reflex to ID Panel     Status: None (Preliminary result)   Collection Time: 01/12/17  9:20 AM  Result Value Ref Range Status   Specimen Description BLOOD RIGHT HAND  Final   Special Requests IN PEDIATRIC BOTTLE 2CC  Final   Culture NO GROWTH < 24 HOURS  Final   Report Status PENDING  Incomplete  Culture, blood (Routine X 2) w Reflex to ID Panel     Status: None (Preliminary result)   Collection Time: 01/12/17  9:40 AM  Result Value Ref Range Status   Specimen Description BLOOD RIGHT HAND  Final   Special Requests IN PEDIATRIC BOTTLE 2CC  Final   Culture NO GROWTH < 24 HOURS  Final   Report Status PENDING  Incomplete         Radiology Studies: No results found.      Scheduled Meds: . ceFEPime (MAXIPIME) IV  1 g Intravenous Q24H  . enoxaparin (LOVENOX) injection  1 mg/kg Subcutaneous  Q2200  . insulin aspart  0-9 Units Subcutaneous Q4H  . levothyroxine  12.5 mcg Intravenous Daily  . mouth rinse  15 mL Mouth Rinse BID  . sodium chloride flush  3 mL Intravenous Q12H  . vancomycin  1,000 mg Intravenous Q24H   Continuous Infusions: . dextrose 5 % and 0.9% NaCl 100 mL/hr at 01/14/17 0457     LOS: 2 days    Time spent: 40 minutes    WOODS, Geraldo Docker, MD [facility] Pager [phone]   If 7PM-7AM, please contact night-coverage [email or web address] Password [email or web address] 01/14/2017, 9:51 AM

## 2017-01-15 LAB — GLUCOSE, CAPILLARY
GLUCOSE-CAPILLARY: 126 mg/dL — AB (ref 65–99)
GLUCOSE-CAPILLARY: 162 mg/dL — AB (ref 65–99)
GLUCOSE-CAPILLARY: 163 mg/dL — AB (ref 65–99)
GLUCOSE-CAPILLARY: 167 mg/dL — AB (ref 65–99)
Glucose-Capillary: 112 mg/dL — ABNORMAL HIGH (ref 65–99)
Glucose-Capillary: 151 mg/dL — ABNORMAL HIGH (ref 65–99)

## 2017-01-15 LAB — BASIC METABOLIC PANEL
Anion gap: 8 (ref 5–15)
BUN: 61 mg/dL — ABNORMAL HIGH (ref 6–20)
CHLORIDE: 120 mmol/L — AB (ref 101–111)
CO2: 21 mmol/L — ABNORMAL LOW (ref 22–32)
Calcium: 7.9 mg/dL — ABNORMAL LOW (ref 8.9–10.3)
Creatinine, Ser: 1.54 mg/dL — ABNORMAL HIGH (ref 0.44–1.00)
GFR calc non Af Amer: 32 mL/min — ABNORMAL LOW (ref >60)
GFR, EST AFRICAN AMERICAN: 37 mL/min — AB (ref >60)
Glucose, Bld: 183 mg/dL — ABNORMAL HIGH (ref 65–99)
POTASSIUM: 4.7 mmol/L (ref 3.5–5.1)
SODIUM: 149 mmol/L — AB (ref 135–145)

## 2017-01-15 LAB — PROTIME-INR
INR: 2.65
Prothrombin Time: 28.8 seconds — ABNORMAL HIGH (ref 11.4–15.2)

## 2017-01-15 MED ORDER — PRAVASTATIN SODIUM 40 MG PO TABS
40.0000 mg | ORAL_TABLET | Freq: Every day | ORAL | Status: DC
  Administered 2017-01-15 – 2017-01-16 (×2): 40 mg via ORAL
  Filled 2017-01-15 (×2): qty 1

## 2017-01-15 MED ORDER — WARFARIN SODIUM 2 MG PO TABS
2.0000 mg | ORAL_TABLET | Freq: Once | ORAL | Status: AC
  Administered 2017-01-15: 2 mg via ORAL
  Filled 2017-01-15: qty 1

## 2017-01-15 MED ORDER — LEVOTHYROXINE SODIUM 25 MCG PO TABS
25.0000 ug | ORAL_TABLET | Freq: Every day | ORAL | Status: DC
  Administered 2017-01-15 – 2017-01-16 (×2): 25 ug via ORAL
  Filled 2017-01-15 (×2): qty 1

## 2017-01-15 MED ORDER — WARFARIN - PHARMACIST DOSING INPATIENT
Freq: Every day | Status: DC
  Administered 2017-01-15: 17:00:00

## 2017-01-15 MED ORDER — SODIUM CHLORIDE 0.9 % IV SOLN
1250.0000 mg | INTRAVENOUS | Status: DC
  Administered 2017-01-15 – 2017-01-16 (×2): 1250 mg via INTRAVENOUS
  Filled 2017-01-15 (×2): qty 1250

## 2017-01-15 NOTE — Progress Notes (Signed)
01/15/2017 foley was removed at 1658. Patient had 300cc of yellow urine. Rico Sheehan RN

## 2017-01-15 NOTE — Progress Notes (Signed)
Patient arrived to unit via bed. IV patent and infusing as ordered. Telemetry placed and verified with CCMD per order. Alert and oriented x3. Disoriented to time/date periodically. Complains of mild pain in right foot; received acetaminophen prior to transfer to floor per eMAR. Stable.

## 2017-01-15 NOTE — Progress Notes (Signed)
PROGRESS NOTE    Danielle Woodard  [id number] DOB: Jan 09, 1941 DOA: 01/12/2017 PCP: No primary care provider on file.   Brief Narrative:  76 y.o. WF PMHx CKD, DM type II uncontrolled with complications, HTN, Chronic Atrial fibrillation on warfarin, Hypothyroidism, COPD, Dilated Cardiomyopathy, Pulmonary Hypertension  She was hospitalized in December 2017 secondary to acute kidney injury/volume depletion/sepsis secondary to enterococcus UTI and C. difficile colitis. S/P surgical repair of RIGHT ankle fracture in late November with no documented wound during previous admission in December. She was subsequently discharged to skilled nursing facility. In January she was sent to the ER for epistaxis related to supratherapeutic INR. She has also had at least one ER visit for hypoxemia presumably related to volume overload and was given a dose of IV Lasix in the ER and discharged back to the facility.  On 2/21 patient was sent to the [facility] ER from the facility secondary to altered mental status and abnormal lab values. This documented that she had again" acute blood loss anemia and abnormal coagulopathy, PA notified of acute renal failure on labs". According to documentation that was sent with the patient from [facility] patient has had progressive altered mentation for 2 weeks with lethargy and poor oral intake with mentation worse over the past 48 hours. Reviewing the medication records reveals patient has been receiving Macrobid and Keflex with last dose due today on 2/22. This was given to treat presumed UTI as well as right lower extremity wound infection. She apparently has had flulike symptoms in the past several weeks with a course of Tamiflu given at the end of January and a 1 day course given on 2/12 with the presumption that her rapid influenza PCR screen was negative thus prompting discontinuation of that medication.  In the ER patient did have a significant acute kidney  injury with a creatinine greater than 4 and a BUN greater than 120 with a potassium of 6.3. She was given fluids, cardioprotective medications and a dose of Kayexalate. It was mentioned that family was considering the possibility of hemodialysis that the patient was subsequently transferred to [facility] for further evaluation and treatment.   Subjective: Patient does not have any complaints this morning. No acute events overnight. She wants to sit in the chair today.     Assessment & Plan:   Principal Problem:   AKI (acute kidney injury) ([place]) Active Problems:   CHF NYHA class I, unspecified failure chronicity, unspecified type ([place])   Metabolic encephalopathy   Type 2 diabetes mellitus with stage 3 chronic kidney disease, with long-term current use of insulin (HCC)   Essential hypertension   Chronic atrial fibrillation (HCC)   Hypothyroidism, acquired   CKD (chronic kidney disease) stage 3, GFR 30-59 ml/min   Decubitus ulcer of right leg, stage 2   COPD (chronic obstructive pulmonary disease) (HCC)   History of Clostridium difficile colitis   SIRS (systemic inflammatory response syndrome) (HCC)   Sacral decubitus ulcer, stage II   Open ankle wound   Diabetes mellitus with complication (HCC)   Acute renal failure with acute tubular necrosis superimposed on stage 3 chronic kidney disease (HCC)   Osteomyelitis of right lower extremity (HCC)   Controlled diabetes mellitus type 2 with complications ([place])   Other hypertrophic cardiomyopathy (HCC)   Panlobular emphysema (HCC)   Sepsis secondary to UTI ([place])   Acute osteomyelitis of right lower leg (HCC)   Acute on CKD (chronic kidney disease) stage 3,(Baseline BUN/Cr 41/1.5)-improved -Patient presents from  skilled nursing facility with altered mentation in setting of significant azotemia. Patient was on both spironolactone/diuretics and ACE inhibitor prior to admission and this had reported failure to thrive symptoms with poor oral  intake and altered mentation for at least 2 weeks. -Continue to hold offending medications -Continue IV fluids Lab Results  Component Value Date   CREATININE 1.54 (H) 01/15/2017   CREATININE 2.13 (H) 01/14/2017   CREATININE 2.71 (H) 01/13/2017  - Cr continues to improve with aggressive hydration -Remove Foley today  Positive Enterococcus Faecium (ESBL) UTI/Sepsis UTI -Discussed case with ID. Recommended continuing vancomycin until discharge on Monday, and then stopping all antibiotics in order for orthopedic surgeons to obtain better biopsy i.e. off antibiotics.  Metabolic encephalopathy-improved with renal function -Likely uremic etiology given significantly elevated BUN at presentation -May have a degree of infectious etiology -Improving  Osteomyelitis Right Lower Leg,/S/P surgical repair right ankle fracture November 2017 -During December admission no documentation that patient had an open wound therefore this has developed since December admission -Continue wound VAC for now-WOC RN consulted -By definition exposure of hardware patient has osteomyelitis,Obtain blood cultures -Continue current antibiotics. -Orthopedic surgery. Recommendations: Hardware should be removed at earliest possible time by original surgeon. -Since we will DC antibiotics at discharge will hold on placing PICC line. Restarting antibiotics per orthopedic surgeon. -Prior to discharge schedule follow-up appointment with patient orthopedic surgeon  Type 2 diabetes mellitus controlled with complication  -NPO so holding 70/30 insulin -Sensitive SSI -2/22 Hemoglobin A1c= 5.6  Other Hypertrophic Cardiomyopathy  -compensated -Appears compensated -Holding ACE inhibitor and diuretics. Consider restarting depending on her renal function. -ECHO December 2017:  LV function with EF 65-70% (hyperdynamic) with mild LVH, study not technically sufficient to evaluate for LV diastolic dysfunction due to atrial fibrillation,  mild aortic stenosis, severely calcified mitral valve without regurgitation or stenosis, severe left atrial dilatation, mild right ventricular dilatation, severely dilated right atrium, mild-to-moderate tricuspid regurgitation, mild pulmonary hypertension 33 mmHg -Strict I&O -Daily weight Filed Weights   01/13/17 0500 01/14/17 0457 01/15/17 0443  Weight: 90.1 kg (198 lb 10.2 oz) 98.2 kg (216 lb 9.6 oz) 101.3 kg (223 lb 6.4 oz)      Essential hypertension -Patient's BP still on the low side but improved. Continue hydration  -Hold BP medication. -Patient currently rate controlled if rate becomes an issue will restart a rate control agent.    Chronic atrial fibrillation  -Currently rate controlled -Hold digoxin in setting of AKI and known elevated digoxin level -Pharmacy to manage warfarin when allowed PO intake- Lovenox for now    Hypothyroidism, acquired -Continue Synthroid (use IV) at half oral dose -TSH mildly elevated in December so we'll repeat with free T4 and T3 -TSH/free T4/T3 most consistent with sick euthyroid syndrome.    COPD (chronic obstructive pulmonary disease)  -Asymptomatic -Duo nebs prn    Dyslipidemia -Restart statin    Sacral decubitus ulcer, stage II -Seems to be related to moisture -Routinely management/dressings per nursing protocol -WOC RN previously consulted for wound VAC as above -Air mattress  Aspiration chronic -Counseled patient family warned dangers of aspirating. Family aware that aspiration could cause additional infection/DEATH. Request patient be allowed to eat and drink until we can evaluate.    DVT prophylaxis: Lovenox Code Status: DO NOT RESUSCITATE Family Communication: None  Disposition Plan: Return to SNF once medically stable.     Consultants:  [place] [facility] Phone Consult with Dr. Scharlene Gloss ID   Procedures/Significant Events:  2/22 CT  RIGHT ankle:Large soft tissue wound about the distal  fibula. Although no CT evidence of osteomyelitis is identified, the patient's fixation hardware and portions of the fibula appear exposed to air. -Fracture fixation hardware in the distal fibula is intact without evidence of loosening.      VENTILATOR SETTINGS:    Cultures 2/20 urine positive Enterococcus Faecium (ESBL) 2/22 blood 2 NGTD 2/22 respiratory virus panel negative     Antimicrobials: Anti-infectives    Start     Stop   01/14/17 1000  vancomycin (VANCOCIN) IVPB 1000 mg/200 mL premix         01/12/17 1100  vancomycin (VANCOCIN) 1,500 mg in sodium chloride 0.9 % 500 mL IVPB     01/12/17 1329   01/12/17 1030  ceFEPIme (MAXIPIME) 1 g in dextrose 5 % 50 mL IVPB             Devices    LINES / TUBES:      Continuous Infusions: . dextrose 5 % and 0.9% NaCl 100 mL/hr at 01/15/17 1341     Objective: Vitals:   01/15/17 0000 01/15/17 0359 01/15/17 0443 01/15/17 0800  BP: (!) 112/49 (!) 126/53  (!) 113/51  Pulse:  (!) 59  70  Resp:  17  16  Temp:  98.1 F (36.7 C)  98.5 F (36.9 C)  TempSrc:  Oral  Oral  SpO2:  96%  96%  Weight:   101.3 kg (223 lb 6.4 oz)   Height:        Intake/Output Summary (Last 24 hours) at 01/15/17 1356 Last data filed at 01/15/17 1003  Gross per 24 hour  Intake             2190 ml  Output              925 ml  Net             1265 ml   Filed Weights   01/13/17 0500 01/14/17 0457 01/15/17 0443  Weight: 90.1 kg (198 lb 10.2 oz) 98.2 kg (216 lb 9.6 oz) 101.3 kg (223 lb 6.4 oz)    Examination:  General:A/O 3, No acute respiratory distress Eyes: negative scleral hemorrhage, negative anisocoria, negative icterus ENT: Negative Runny nose, negative gingival bleeding, Neck:  Negative scars, masses, torticollis, lymphadenopathy, JVD Lungs: Clear to auscultation bilaterally without wheezes or crackles Cardiovascular: Irregular irregular rhythm and rate, without murmur gallop or rub normal S1 and S2 Abdomen: negative abdominal  pain, nondistended, positive soft, bowel sounds, no rebound, no ascites, no appreciable mass Extremities: right ankle with wound VAC in place. Warm to the touch, DP pulse one plus  Psychiatric:  Negative depression, negative anxiety, negative fatigue, negative mania  Central nervous system:  Cranial nerves II through XII intact, tongue/uvula midline, all extremities muscle strength 5/5, sensation intact throughout,  negative dysarthria, negative expressive aphasia, negative receptive aphasia.  .     Data Reviewed: Care during the described time interval was provided by me .  I have reviewed this patient's available data, including medical history, events of note, physical examination, and all test results as part of my evaluation. I have personally reviewed and interpreted all radiology studies.  CBC:  Recent Labs Lab 01/12/17 0745 01/13/17 1148  WBC 10.9* 9.4  NEUTROABS 7.1  --   HGB 10.3* 11.1*  HCT 33.4* 38.0  MCV 97.7 100.0  PLT 288 [id number]   Basic Metabolic Panel:  Recent Labs Lab 01/12/17 0745 01/13/17 1148 01/14/17 0233 01/15/17 0316  NA 145 149* 147* 149*  K 6.2* 5.5* 5.2* 4.7  CL 110 117* 117* 120*  CO2 25 24 23  21*  GLUCOSE 101* 103* 111* 183*  BUN 109* 93* 80* 61*  CREATININE 3.87* 2.71* 2.13* 1.54*  CALCIUM 7.7* 8.1* 8.0* 7.9*   GFR: Estimated Creatinine Clearance: 35.3 mL/min (by C-G formula based on SCr of 1.54 mg/dL (H)). Liver Function Tests:  Recent Labs Lab 01/12/17 0745 01/13/17 1148  AST 45* 56*  ALT 13* 18  ALKPHOS 63 73  BILITOT 1.2 1.3*  PROT 5.0* 5.7*  ALBUMIN 1.7* 1.8*   No results for input(s): LIPASE, AMYLASE in the last 168 hours. No results for input(s): AMMONIA in the last 168 hours. Coagulation Profile:  Recent Labs Lab 01/12/17 0745 01/13/17 1148 01/14/17 0233 01/15/17 0316  INR 2.26 2.26 2.05 2.65   Cardiac Enzymes:  Recent Labs Lab 01/12/17 0745  CKTOTAL 145   BNP (last 3 results) No results for input(s): PROBNP  in the last 8760 hours. HbA1C: No results for input(s): HGBA1C in the last 72 hours. CBG:  Recent Labs Lab 01/14/17 1607 01/14/17 2024 01/15/17 0016 01/15/17 0426 01/15/17 0832  GLUCAP 177* 169* 167* 163* 112*   Lipid Profile: No results for input(s): CHOL, HDL, LDLCALC, TRIG, CHOLHDL, LDLDIRECT in the last 72 hours. Thyroid Function Tests:  Recent Labs  01/13/17 1148  FREET4 1.30*   Anemia Panel: No results for input(s): VITAMINB12, FOLATE, FERRITIN, TIBC, IRON, RETICCTPCT in the last 72 hours. Urine analysis:    Component Value Date/Time   COLORURINE YELLOW 01/12/2017 0746   APPEARANCEUR HAZY (A) 01/12/2017 0746   LABSPEC 1.013 01/12/2017 0746   PHURINE 5.0 01/12/2017 0746   GLUCOSEU NEGATIVE 01/12/2017 0746   HGBUR NEGATIVE 01/12/2017 0746   BILIRUBINUR NEGATIVE 01/12/2017 0746   KETONESUR NEGATIVE 01/12/2017 0746   PROTEINUR NEGATIVE 01/12/2017 0746   NITRITE NEGATIVE 01/12/2017 0746   LEUKOCYTESUR MODERATE (A) 01/12/2017 0746   Sepsis Labs: @LABRCNTIP (procalcitonin:4,lacticidven:4)  ) Recent Results (from the past 240 hour(s))  MRSA PCR Screening     Status: None   Collection Time: 01/12/17  3:57 AM  Result Value Ref Range Status   MRSA by PCR NEGATIVE NEGATIVE Final    Comment:        The GeneXpert MRSA Assay (FDA approved for NASAL specimens only), is one component of a comprehensive MRSA colonization surveillance program. It is not intended to diagnose MRSA infection nor to guide or monitor treatment for MRSA infections.   Urine culture     Status: Abnormal   Collection Time: 01/12/17  7:46 AM  Result Value Ref Range Status   Specimen Description URINE, RANDOM  Final   Special Requests NONE  Final   Culture 70,000 COLONIES/mL ENTEROCOCCUS FAECIUM (A)  Final   Report Status 01/14/2017 FINAL  Final   Organism ID, Bacteria ENTEROCOCCUS FAECIUM (A)  Final      Susceptibility   Enterococcus faecium - MIC*    AMPICILLIN >=32 RESISTANT Resistant      LEVOFLOXACIN >=8 RESISTANT Resistant     NITROFURANTOIN 128 RESISTANT Resistant     VANCOMYCIN <=0.5 SENSITIVE Sensitive     * 70,000 COLONIES/mL ENTEROCOCCUS FAECIUM  Respiratory Panel by PCR     Status: None   Collection Time: 01/12/17  8:51 AM  Result Value Ref Range Status   Adenovirus NOT DETECTED NOT DETECTED Final   Coronavirus 229E NOT DETECTED NOT DETECTED Final   Coronavirus HKU1 NOT DETECTED NOT DETECTED Final   Coronavirus  NL63 NOT DETECTED NOT DETECTED Final   Coronavirus OC43 NOT DETECTED NOT DETECTED Final   Metapneumovirus NOT DETECTED NOT DETECTED Final   Rhinovirus / Enterovirus NOT DETECTED NOT DETECTED Final   Influenza A NOT DETECTED NOT DETECTED Final   Influenza B NOT DETECTED NOT DETECTED Final   Parainfluenza Virus 1 NOT DETECTED NOT DETECTED Final   Parainfluenza Virus 2 NOT DETECTED NOT DETECTED Final   Parainfluenza Virus 3 NOT DETECTED NOT DETECTED Final   Parainfluenza Virus 4 NOT DETECTED NOT DETECTED Final   Respiratory Syncytial Virus NOT DETECTED NOT DETECTED Final   Bordetella pertussis NOT DETECTED NOT DETECTED Final   Chlamydophila pneumoniae NOT DETECTED NOT DETECTED Final   Mycoplasma pneumoniae NOT DETECTED NOT DETECTED Final  Culture, blood (Routine X 2) w Reflex to ID Panel     Status: None (Preliminary result)   Collection Time: 01/12/17  9:20 AM  Result Value Ref Range Status   Specimen Description BLOOD RIGHT HAND  Final   Special Requests IN PEDIATRIC BOTTLE 2CC  Final   Culture NO GROWTH 2 DAYS  Final   Report Status PENDING  Incomplete  Culture, blood (Routine X 2) w Reflex to ID Panel     Status: None (Preliminary result)   Collection Time: 01/12/17  9:40 AM  Result Value Ref Range Status   Specimen Description BLOOD RIGHT HAND  Final   Special Requests IN PEDIATRIC BOTTLE 2CC  Final   Culture NO GROWTH 2 DAYS  Final   Report Status PENDING  Incomplete         Radiology Studies: No results found.      Scheduled  Meds: . ceFEPime (MAXIPIME) IV  1 g Intravenous Q24H  . insulin aspart  0-9 Units Subcutaneous Q4H  . levothyroxine  25 mcg Oral QAC breakfast  . mouth rinse  15 mL Mouth Rinse BID  . sodium chloride flush  3 mL Intravenous Q12H  . vancomycin  1,250 mg Intravenous Q24H  . warfarin  2 mg Oral ONCE-1800  . Warfarin - Pharmacist Dosing Inpatient   Does not apply q1800   Continuous Infusions: . dextrose 5 % and 0.9% NaCl 100 mL/hr at 01/15/17 1341     LOS: 3 days    Time spent: 40 minutes    Ankit Arsenio Loader, MD [facility] Pager [phone]   If 7PM-7AM, please contact night-coverage [email or web address] Password [email or web address] 01/15/2017, 1:56 PM

## 2017-01-15 NOTE — Progress Notes (Signed)
ANTICOAGULATION CONSULT NOTE - Follow Up Consult  Pharmacy Consult for Lovenox/Warfarin Indication: atrial fibrillation  No Known Allergies  Patient Measurements: Height: 5\' 3"  (160 cm) Weight: 223 lb 6.4 oz (101.3 kg) IBW/kg (Calculated) : 52.4  Vital Signs: Temp: 98.1 F (36.7 C) (02/25 0359) Temp Source: Oral (02/25 0359) BP: 126/53 (02/25 0359) Pulse Rate: 59 (02/25 0359)  Labs:  Recent Labs  01/13/17 1148 01/14/17 0233 01/15/17 0316  HGB 11.1*  --   --   HCT 38.0  --   --   PLT 186  --   --   LABPROT 25.3* 23.5* 28.8*  INR 2.26 2.05 2.65  CREATININE 2.71* 2.13* 1.54*    Estimated Creatinine Clearance: 35.3 mL/min (by C-G formula based on SCr of 1.54 mg/dL (H)).   Medical History: Past Medical History:  Diagnosis Date  . CHF (congestive heart failure) ([place])   . Chronic kidney disease   . Diabetes mellitus without complication ([place])   . Dysrhythmia   . Hypertension   . Hypothyroidism     Assessment: 2 YOF on warfarin PTA for h/o Afib. Pharmacy consulted to manage warfarin and start Lovenox once INR < 2 since patient was NPO. 2/24 speech eval recommends whole meds with liquid. Patient received once dose of Lovenox 100 mg last night, INR today therapeutic at 2.65. No recent CBC, no bleeding noted.  PTA dose: 2mg  daily  Goal of Therapy:  INR 2-3 Monitor platelets by anticoagulation protocol: Yes   Plan:  D/c Lovenox Warfarin 2 mg PO tonight Daily INR   Gwenlyn Perking, PharmD PGY1 Pharmacy Resident Pager: [phone] 01/15/2017 8:01 AM

## 2017-01-15 NOTE — Progress Notes (Signed)
Pharmacy Antibiotic Note  Danielle Woodard is a 76 y.o. female admitted on 01/12/2017 with AMS concerning for sepsis of possible urinary or wound source. Pharmacy has been consulted for Vancomycin + Cefepime dosing. Today is day 4 of antibiotics. WBC down 9.4, afebrile, CrCl improved to ~35 ml/min  Patient received vancomycin 1500 mg load on 2/22  Plan: Increase vancomycin to 1250 mg IV Q24H Continue cefepime 1g IV Q24H Monitor renal function Plan to d/c antibiotics upon discharge on 2/26 per ID in order for orthopedic surgeons to obtain better biopsy   Height: 5\' 3"  (160 cm) Weight: 223 lb 6.4 oz (101.3 kg) IBW/kg (Calculated) : 52.4  Temp (24hrs), Avg:97.8 F (36.6 C), Min:97.5 F (36.4 C), Max:98.1 F (36.7 C)   Recent Labs Lab 01/12/17 0745 01/13/17 1148 01/14/17 0233 01/15/17 0316  WBC 10.9* 9.4  --   --   CREATININE 3.87* 2.71* 2.13* 1.54*    Estimated Creatinine Clearance: 35.3 mL/min (by C-G formula based on SCr of 1.54 mg/dL (H)).    No Known Allergies  Antimicrobials this admission: Ceftriaxone 2/21 x 1 @ [place] Foley 2/22 >> Cefepime 2/22 >>  Microbiology results: 2/22 BCx >> ngtd 2/22 MRSA PCR >> neg 2/22 RVP >> neg 2/22 UCx >> 70K colonies Enterococcus (S-vanco only)  Thank you for allowing pharmacy to be a part of this patient's care.  Gwenlyn Perking, PharmD PGY1 Pharmacy Resident Pager: [phone] 01/15/2017 8:20 AM

## 2017-01-16 DIAGNOSIS — N170 Acute kidney failure with tubular necrosis: Secondary | ICD-10-CM

## 2017-01-16 DIAGNOSIS — G9341 Metabolic encephalopathy: Secondary | ICD-10-CM

## 2017-01-16 DIAGNOSIS — E118 Type 2 diabetes mellitus with unspecified complications: Secondary | ICD-10-CM

## 2017-01-16 DIAGNOSIS — M86161 Other acute osteomyelitis, right tibia and fibula: Secondary | ICD-10-CM

## 2017-01-16 DIAGNOSIS — I509 Heart failure, unspecified: Secondary | ICD-10-CM

## 2017-01-16 DIAGNOSIS — I10 Essential (primary) hypertension: Secondary | ICD-10-CM

## 2017-01-16 DIAGNOSIS — N183 Chronic kidney disease, stage 3 (moderate): Secondary | ICD-10-CM

## 2017-01-16 DIAGNOSIS — N179 Acute kidney failure, unspecified: Principal | ICD-10-CM

## 2017-01-16 LAB — BASIC METABOLIC PANEL
ANION GAP: 3 — AB (ref 5–15)
BUN: 41 mg/dL — ABNORMAL HIGH (ref 6–20)
CO2: 23 mmol/L (ref 22–32)
Calcium: 8.3 mg/dL — ABNORMAL LOW (ref 8.9–10.3)
Chloride: 120 mmol/L — ABNORMAL HIGH (ref 101–111)
Creatinine, Ser: 1.18 mg/dL — ABNORMAL HIGH (ref 0.44–1.00)
GFR calc Af Amer: 51 mL/min — ABNORMAL LOW (ref >60)
GFR, EST NON AFRICAN AMERICAN: 44 mL/min — AB (ref >60)
GLUCOSE: 139 mg/dL — AB (ref 65–99)
POTASSIUM: 4.9 mmol/L (ref 3.5–5.1)
SODIUM: 146 mmol/L — AB (ref 135–145)

## 2017-01-16 LAB — GLUCOSE, CAPILLARY
GLUCOSE-CAPILLARY: 143 mg/dL — AB (ref 65–99)
GLUCOSE-CAPILLARY: 162 mg/dL — AB (ref 65–99)
GLUCOSE-CAPILLARY: 162 mg/dL — AB (ref 65–99)
Glucose-Capillary: 132 mg/dL — ABNORMAL HIGH (ref 65–99)
Glucose-Capillary: 156 mg/dL — ABNORMAL HIGH (ref 65–99)

## 2017-01-16 LAB — PROTIME-INR
INR: 2.41
Prothrombin Time: 26.7 seconds — ABNORMAL HIGH (ref 11.4–15.2)

## 2017-01-16 MED ORDER — WARFARIN SODIUM 1 MG PO TABS
1.0000 mg | ORAL_TABLET | Freq: Once | ORAL | Status: AC
  Administered 2017-01-16: 1 mg via ORAL
  Filled 2017-01-16: qty 1

## 2017-01-16 MED ORDER — WARFARIN SODIUM 1 MG PO TABS: 1.0000 mg | ORAL_TABLET | Freq: Once | ORAL | 0 refills | Status: AC

## 2017-01-16 NOTE — Progress Notes (Signed)
Danielle Woodard to be D/C'd Skilled nursing facility per MD order.  Discussed prescriptions and follow up appointments with the patient. Prescriptions given to patient, medication list explained in detail. Pt verbalized understanding.  Allergies as of 01/16/2017   No Known Allergies     Medication List    STOP taking these medications   cephALEXin 500 MG capsule Commonly known as:  KEFLEX   gabapentin 100 MG capsule Commonly known as:  NEURONTIN   lisinopril 40 MG tablet Commonly known as:  PRINIVIL,ZESTRIL   nitrofurantoin (macrocrystal-monohydrate) 100 MG capsule Commonly known as:  MACROBID   spironolactone 25 MG tablet Commonly known as:  ALDACTONE     TAKE these medications   albuterol (5 MG/ML) 0.5% nebulizer solution Commonly known as:  PROVENTIL Take 2.5 mg by nebulization every 6 (six) hours as needed for wheezing or shortness of breath.   aspirin EC 81 MG tablet Take 81 mg by mouth daily.   cloNIDine 0.1 MG tablet Commonly known as:  CATAPRES Take 0.1 mg by mouth 2 (two) times daily as needed (for systolic blood pressure equal to or above 160).   digoxin 0.125 MG tablet Commonly known as:  LANOXIN Take 0.125 mg by mouth daily.   docusate sodium 100 MG capsule Commonly known as:  COLACE Take 100 mg by mouth 2 (two) times daily as needed for mild constipation.   febuxostat 40 MG tablet Commonly known as:  ULORIC Take 40 mg by mouth daily.   feeding supplement (PRO-STAT SUGAR FREE 64) Liqd Take 30 mLs by mouth daily.   fenofibrate 145 MG tablet Commonly known as:  TRICOR Take 145 mg by mouth at bedtime.   furosemide 20 MG tablet Commonly known as:  LASIX Take 20 mg by mouth daily.   hydrALAZINE 10 MG tablet Commonly known as:  APRESOLINE Take 15 mg by mouth 4 (four) times daily.   insulin aspart protamine- aspart (70-30) 100 UNIT/ML injection Commonly known as:  NOVOLOG MIX 70/30 Inject 20-35 Units into the skin See admin instructions. Inject  35 units SQ in the morning and inject 20 units SQ ain the evening.   levothyroxine 25 MCG tablet Commonly known as:  SYNTHROID, LEVOTHROID Take 25 mcg by mouth daily at 6 (six) AM.   metoprolol 50 MG tablet Commonly known as:  LOPRESSOR Take 50 mg by mouth 2 (two) times daily.   mirtazapine 15 MG tablet Commonly known as:  REMERON Take 15 mg by mouth at bedtime.   nystatin powder Generic drug:  nystatin Apply 1 g topically 2 (two) times daily as needed (for rash).   omeprazole 20 MG capsule Commonly known as:  PRILOSEC Take 20 mg by mouth daily.   oxyCODONE 5 MG immediate release tablet Commonly known as:  Oxy IR/ROXICODONE Take 1 tablet (5 mg total) by mouth every 6 (six) hours as needed for moderate pain (pain).   OXYGEN Inhale 2 L into the lungs continuous.   pravastatin 40 MG tablet Commonly known as:  PRAVACHOL Take 40 mg by mouth at bedtime.   PROBIOTIC ACIDOPHILUS PO Take 1 capsule by mouth daily.   Simethicone 180 MG Caps Take 180 mg by mouth every 6 (six) hours as needed (for gas pain).   traMADol 50 MG tablet Commonly known as:  ULTRAM Take 1 tablet (50 mg total) by mouth every 6 (six) hours as needed (pain).   warfarin 1 MG tablet Commonly known as:  COUMADIN Take 1 tablet (1 mg total) by mouth one time  only at 6 PM.   zinc sulfate 220 (50 Zn) MG capsule Take 220 mg by mouth daily.       Vitals:   01/16/17 1000 01/16/17 1700  BP: (!) 142/72 (!) 143/74  Pulse: 89 72  Resp: 20 20  Temp: 97.5 F (36.4 C) 98.6 F (37 C)    Skin clean, dry and intact without evidence of skin break down, no evidence of skin tears noted. IV catheter discontinued intact. Site without signs and symptoms of complications. Dressing and pressure applied. Pt denies pain at this time. No complaints noted.  An After Visit Summary was printed and given to the patient. Patient escorted via [place], and D/C home via private auto.  Haywood Lasso BSN, RN [facility] Phone [phone]

## 2017-01-16 NOTE — Clinical Social Work Note (Addendum)
Clinical Social Work Assessment  Patient Details  Name: Danielle Woodard MRN: [id number] Date of Birth: 14-Oct-1941  Date of referral:  01/15/17               Reason for consult:  Facility Placement                Permission sought to share information with:  Family Supports Permission granted to share information::  Yes, Verbal Permission Granted  Name::     Danielle Woodard  Agency::     Relationship::  Sister-in-law  Contact Information:  [phone]  Housing/Transportation Living arrangements for the past 2 months:  [place] of Information:  Patient Patient Interpreter Needed:  None Criminal Activity/Legal Involvement Pertinent to Current Situation/Hospitalization:  No - Comment as needed Significant Relationships:  Adult Children, Other Family Members Lives with:  Facility Resident Investment banker, operational) Do you feel safe going back to the place where you live?  Yes Need for family participation in patient care:  Yes (Comment)  Care giving concerns:  Patient currently from a skilled facility and plans to return and continue her rehab.   Social Worker assessment / plan:  CSW talked with patient at the bedside. Danielle Woodard was lying in bed napping, but awakened easily and talked with CSW. Patient was advised that she is ready for discharge today. Danielle Woodard confirmed she is from [facility] and plans to return there at discharge. CSW advised that her brother Danielle Woodard and sister-in-law Danielle Woodard are involved and do look in on her. Danielle Woodard also advised CSW that she does not have any children.  Employment status:  Retired Forensic scientist:  Information systems manager, Medicaid In [place] PT Recommendations:  Not assessed at this time [place] / Referral to community resources:  Other (Comment Required) (Information not requested or provided as patient from a skilled facility)  Patient/Family's Response to care:  No concerns expressed by patient regarding care during  hospitalization.  Patient/Family's Understanding of and Emotional Response to Diagnosis, Current Treatment, and Prognosis:  Not discussed.  Emotional Assessment Appearance:  Appears stated age Attitude/Demeanor/Rapport:  Other (Appropriate) Affect (typically observed):  Appropriate Orientation:  Oriented to Self, Oriented to Place, Oriented to  Time, Oriented to Situation Alcohol / Substance use:  Never Used Psych involvement (Current and /or in the community):  No (Comment)  Discharge Needs  Concerns to be addressed:  Discharge Planning Concerns Readmission within the last 30 days:  No Current discharge risk:  None Barriers to Discharge:  No Barriers Identified   Sable Feil, LCSW 01/16/2017, 2:47 PM

## 2017-01-16 NOTE — Clinical Social Work Note (Signed)
Patient discharging back to [facility] today, transported by ambulance. At patient's request, her sister-in-law, Youlanda Roys was contacted [phone]) and informed of discharge. Discharge clinicals transmitted to facility.  Gaynor Genco Givens, MSW, LCSW Licensed Clinical Social Worker [place] [phone]

## 2017-01-16 NOTE — Consult Note (Signed)
[facility] Nurse wound consult note Reason for Consult: Negative pressure device dressing change performed; this was requested to be changed on Sat by the bedside nurse but dressing is intact from Thursday .   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 Drainage (amount, consistency, odor) Small amt yellow drainage in the cannister, no odor Dressing procedure/placement/frequency: Applied one piece black foam to 197mm cont suction.  Pt tolerated with minimal amt discomfort.   Plan to change dressing on Wed. Pt can resume followup with her previous physician after discharge. Julien Girt MSN, RN, [place], [place], [place]

## 2017-01-16 NOTE — Discharge Summary (Signed)
Physician Discharge Summary  Danielle Woodard  [id number]  DOB: 05-02-1941  DOA: 01/12/2017 PCP: No primary care provider on file.  Admit date: 01/12/2017 Discharge date: 01/16/2017  Admitted From:  SNF Disposition:  SNF  Recommendations for Outpatient Follow-up:  1. Follow up with PCP in 1-2 weeks 2. Please obtain BMP/CBC in one week 3. Please follow up on the following pending results: Final blood cultures  4. Follow up with Orthopedics - needs hardware removal   Discharge Condition: Improved   CODE STATUS: DNR Diet recommendation: Carb modified - soft   Brief/Interim Summary: 76 y.o. WF PMHx CKD, DM type II uncontrolled with complications, HTN, Chronic Atrial fibrillation on warfarin, Hypothyroidism, COPD, Dilated Cardiomyopathy, Pulmonary Hypertension  She was hospitalizedin December 2017 secondary to acute kidney injury/volume depletion/sepsis secondary to enterococcus UTI and C. difficile colitis. S/P surgical repair of RIGHT ankle fracture in late November with no documented wound during previous admission in December.She was subsequently discharged to skilled nursing facility. In January she was sent to the ER for epistaxis related to supratherapeutic INR. She has also had at least one ER visit for hypoxemia presumably related to volume overload and was given a dose of IV Lasix in the ER and discharged back to the facility.  On 2/21 patient was sent to the [facility] ER from the facility secondary to altered mental status and abnormal lab values. This documented that she had again" acute blood loss anemia and abnormal coagulopathy, PA notified of acute renal failure on labs". According to documentation that was sent with the patient from [facility] patient has had progressive altered mentation for 77 weeks with lethargy and poor oral intake with mentation worse over the past 48 hours. Reviewing the medication records reveals patient has been receiving Macrobid and  Keflex with last dose due today on 2/22. This was given to treat presumed UTI as well as right lower extremity wound infection. She apparently has had flulike symptoms in the past several weeks with a course of Tamiflu given at the end of January and a 1 day course given on 2/12 with the presumption that her rapid influenza PCR screen was negative thus prompting discontinuation of that medication.  In the ER patient did have a significant acute kidney injury with a creatinine greater than 4 and a BUN greater than 120 with a potassium of 6.3. She was given fluids, cardioprotective medications and a dose of Kayexalate. It was mentioned that family was considering the possibility of hemodialysis that the patient was subsequently transferred to [facility] for further evaluation and treatment. Patient was treated with supportive care. Patient ws found to have possible osteomyelitis of the surgical repair site at the right ankle. Orthopedics surgeon were consulted and recommended abx with hardware removal by the original surgeon. Patient was also found to have ESBL which was treated with Vancomycin for 5 days. Per ID recommendations, patient would not continue with abx to allow orthopedics to perform biopsy of the wound and hardware should be removed as soon as possible. Patient was initially confuse, now mentally bact to baseline   Subjective: Patient seen and examined doing well, alert orient x 4. Only c/o r LE pain at the site of the wound , patient report that her dressings were changed earlier causing some pain. Otherwise have no concerns. She has her foley removed yesterday, and is urinating with no problems.   Discharge Diagnoses/Hospital Course:  Principal Problem:   AKI (acute kidney injury) ([place]) Active Problems:   CHF  NYHA class I, unspecified failure chronicity, unspecified type ([place])   Metabolic encephalopathy   Type 2 diabetes mellitus with stage 3 chronic kidney disease, with long-term current  use of insulin (HCC)   Essential hypertension   Chronic atrial fibrillation (HCC)   Hypothyroidism, acquired   CKD (chronic kidney disease) stage 3, GFR 30-59 ml/min   Decubitus ulcer of right leg, stage 2   COPD (chronic obstructive pulmonary disease) (HCC)   History of Clostridium difficile colitis   SIRS (systemic inflammatory response syndrome) (HCC)   Sacral decubitus ulcer, stage II   Open ankle wound   Diabetes mellitus with complication (HCC)   Acute renal failure with acute tubular necrosis superimposed on stage 3 chronic kidney disease (HCC)   Osteomyelitis of right lower extremity (HCC)   Controlled diabetes mellitus type 2 with complications ([place])   Other hypertrophic cardiomyopathy ([place])   Panlobular emphysema (HCC)   Sepsis secondary to UTI ([place])   Acute osteomyelitis of right lower leg (HCC)  Acute on CKD (chronic kidney disease) stage 3,(Baseline BUN/Cr 41/1.5)-improved -Patient presents from skilled nursing facility with altered mentation in setting of significant azotemia. Patient was on both spironolactone/diureticsand ACE inhibitor prior to admission and was reported failure to thrive symptoms with poor oral intake and altered mentation for at least 2 weeks. -Continue to hold ACE  -Encourage oral hydration   Positive Enterococcus Faecium (ESBL) UTI/Sepsis UTI -This was discussed with ID recommending vancomycin for 5 days. Then d/c antibiotics to allow orthopedics surgery to obtain biopsy of the right wound.   Metabolic encephalopathy improved with renal function - resolved  -Likely uremic etiology given significantly elevated BUN atpresentation -May have a degree of infectious etiology  Osteomyelitis Right Lower Leg,/S/P surgical repair right ankle fracture November 2017 -During December admission no documentation that patient had an open wound therefore this has developed since December admission -Continue wound VAC for now - needs evaluation by orthopedics   -By definition exposure of hardware patient has osteomyelitis -Blood cultures no growth up to date  -All antibiotics discontinued per ID recommendation - patient need hardware removal  -Orthopedic surgery. Recommendations: Hardware should be removed at earliest possible time by original surgeon. -Patient has appointment with [facility] @ 10:15 AM - patient needs to attend to this appointment for evaluation of hardware removal.  Type 2 diabetes mellitus controlled with complication  -Resume home insuline  -2/22 Hemoglobin A1c= 5.6  Hypertrophic Cardiomyopathy -compensated -Appears compensated -Holding ACE inhibitor and Aldactone -Will resume Lasix - Monitor Cr in 1 week . -ECHO December 2017: LV function with EF 65-70% (hyperdynamic)with mild LVH, study not technically sufficient to evaluate for LV diastolic dysfunction due to atrial fibrillation, mild aortic stenosis, severely calcified mitral valve without regurgitation or stenosis, severe left atrial dilatation, mild right ventricular dilatation, severely dilated right atrium, mild-to-moderate tricuspid regurgitation,mild pulmonary hypertension 33 mmHg -Strict I&O  -Daily weight  Essential hypertension - stable   -Resume home meds except ACE inhibitors  -Monitor BP   Chronic atrial fibrillation  -Currently rate controlled  -Will resume warfarin at 1 mg daily - INR therapeutic - goal 2-3 -Check IRN in 3-4 days   Hypothyroidism, acquired -Continue Synthroid (use IV) at half oral dose -TSH/free T4/T3 most consistent with sick euthyroid syndrome.  COPD (chronic obstructive pulmonary disease)  -Asymptomatic -Duo nebs prn  Dyslipidemia -Restart statin  Sacral decubitus ulcer, stage II -Seems to be related to moisture -Air mattress  All other chronic medical condition were stable during  the hospitalization.  On the day of the discharge the patient's vitals were stable, and  no other acute medical condition were reported by patient. the patient was felt safe to be discharge to SNF  Discharge Instructions  You were cared for by a hospitalist during your hospital stay. If you have any questions about your discharge medications or the care you received while you were in the hospital after you are discharged, you can call the unit and asked to speak with the hospitalist on call if the hospitalist that took care of you is not available. Once you are discharged, your primary care physician will handle any further medical issues. Please note that NO REFILLS for any discharge medications will be authorized once you are discharged, as it is imperative that you return to your primary care physician (or establish a relationship with a primary care physician if you do not have one) for your aftercare needs so that they can reassess your need for medications and monitor your lab values.  Discharge Instructions    Call MD for:  difficulty breathing, headache or visual disturbances    Complete by:  As directed    Call MD for:  extreme fatigue    Complete by:  As directed    Call MD for:  hives    Complete by:  As directed    Call MD for:  persistant dizziness or light-headedness    Complete by:  As directed    Call MD for:  persistant nausea and vomiting    Complete by:  As directed    Call MD for:  redness, tenderness, or signs of infection (pain, swelling, redness, odor or green/yellow discharge around incision site)    Complete by:  As directed    Call MD for:  severe uncontrolled pain    Complete by:  As directed    Call MD for:  temperature >100.4    Complete by:  As directed    Change dressing (specify)    Complete by:  As directed    Dressing change: every other day   Diet - low sodium heart healthy    Complete by:  As directed    Discharge instructions    Complete by:  As directed    Keep wound vac continues Change dressing every other day   Discharge wound  care:    Complete by:  As directed    Continue wound vac   Increase activity slowly    Complete by:  As directed      Allergies as of 01/16/2017   No Known Allergies     Medication List    STOP taking these medications   cephALEXin 500 MG capsule Commonly known as:  KEFLEX   gabapentin 100 MG capsule Commonly known as:  NEURONTIN   lisinopril 40 MG tablet Commonly known as:  PRINIVIL,ZESTRIL   nitrofurantoin (macrocrystal-monohydrate) 100 MG capsule Commonly known as:  MACROBID   spironolactone 25 MG tablet Commonly known as:  ALDACTONE     TAKE these medications   albuterol (5 MG/ML) 0.5% nebulizer solution Commonly known as:  PROVENTIL Take 2.5 mg by nebulization every 6 (six) hours as needed for wheezing or shortness of breath.   aspirin EC 81 MG tablet Take 81 mg by mouth daily.   cloNIDine 0.1 MG tablet Commonly known as:  CATAPRES Take 0.1 mg by mouth 2 (two) times daily as needed (for systolic blood pressure equal to or above 160).   digoxin 0.125 MG  tablet Commonly known as:  LANOXIN Take 0.125 mg by mouth daily.   docusate sodium 100 MG capsule Commonly known as:  COLACE Take 100 mg by mouth 2 (two) times daily as needed for mild constipation.   febuxostat 40 MG tablet Commonly known as:  ULORIC Take 40 mg by mouth daily.   feeding supplement (PRO-STAT SUGAR FREE 64) Liqd Take 30 mLs by mouth daily.   fenofibrate 145 MG tablet Commonly known as:  TRICOR Take 145 mg by mouth at bedtime.   furosemide 20 MG tablet Commonly known as:  LASIX Take 20 mg by mouth daily.   hydrALAZINE 10 MG tablet Commonly known as:  APRESOLINE Take 15 mg by mouth 4 (four) times daily.   insulin aspart protamine- aspart (70-30) 100 UNIT/ML injection Commonly known as:  NOVOLOG MIX 70/30 Inject 20-35 Units into the skin See admin instructions. Inject 35 units SQ in the morning and inject 20 units SQ ain the evening.   levothyroxine 25 MCG tablet Commonly known  as:  SYNTHROID, LEVOTHROID Take 25 mcg by mouth daily at 6 (six) AM.   metoprolol 50 MG tablet Commonly known as:  LOPRESSOR Take 50 mg by mouth 2 (two) times daily.   mirtazapine 15 MG tablet Commonly known as:  REMERON Take 15 mg by mouth at bedtime.   nystatin powder Generic drug:  nystatin Apply 1 g topically 2 (two) times daily as needed (for rash).   omeprazole 20 MG capsule Commonly known as:  PRILOSEC Take 20 mg by mouth daily.   oxyCODONE 5 MG immediate release tablet Commonly known as:  Oxy IR/ROXICODONE Take 1 tablet (5 mg total) by mouth every 6 (six) hours as needed for moderate pain (pain).   OXYGEN Inhale 2 L into the lungs continuous.   pravastatin 40 MG tablet Commonly known as:  PRAVACHOL Take 40 mg by mouth at bedtime.   PROBIOTIC ACIDOPHILUS PO Take 1 capsule by mouth daily.   Simethicone 180 MG Caps Take 180 mg by mouth every 6 (six) hours as needed (for gas pain).   traMADol 50 MG tablet Commonly known as:  ULTRAM Take 1 tablet (50 mg total) by mouth every 6 (six) hours as needed (pain).   warfarin 1 MG tablet Commonly known as:  COUMADIN Take 1 tablet (1 mg total) by mouth one time only at 6 PM.   zinc sulfate 220 (50 Zn) MG capsule Take 220 mg by mouth daily.      Contact information for after-discharge care    [facility] .   Specialty:  [place] Contact information: [address] [place] [phone]             No Known Allergies  Consultations:  None    Procedures/Studies: Ct Ankle Right Wo Contrast  Result Date: 01/12/2017 CLINICAL DATA:  History of surgical repair of right ankle fracture in November, 2017 with chronic infection. Wound VAC placed EXAM: CT OF THE RIGHT ANKLE WITHOUT CONTRAST TECHNIQUE: Multidetector CT imaging of the right ankle was performed according to the standard protocol. Multiplanar CT image reconstructions were  also generated. COMPARISON:  Intraoperative views of the right ankle 08/21/2016. Plain films right ankle 08/19/2016. FINDINGS: Bones/Joint/Cartilage Plate and screws are seen fixing a distal fibular fracture. Hardware is intact. There is no lucency about the screws. The fracture is in near anatomic position and alignment. Fracture lines remain visible. Bones appear diffusely osteopenic. No bony destructive change  or periosteal reaction is seen. Ligaments Suboptimally assessed by CT. Muscles and Tendons No intramuscular fluid collection is identified.  No tear. Soft tissues Large soft tissue wound is seen about the patient's fixation hardware in the distal fibula. Portions of hardware and bone appear exposed to air. IMPRESSION: Large soft tissue wound about the distal fibula. Although no CT evidence of osteomyelitis is identified, the patient's fixation hardware and portions of the fibula appear exposed to air. Negative for soft tissue abscess. Fracture fixation hardware in the distal fibula is intact without evidence of loosening. The fracture is in anatomic position and alignment. Fracture lines remain visible. Electronically Signed   By: Inge Rise M.D.   On: 01/12/2017 10:48   Dg Abd Portable 1v  Result Date: 01/12/2017 CLINICAL DATA:  Lethargic, nonspecific abdominal pain EXAM: PORTABLE ABDOMEN - 1 VIEW COMPARISON:  None. FINDINGS: Portable supine views of the abdomen show no evidence of bowel obstruction. No opaque calculi are seen. There are degenerative changes in the mid to lower thoracic spine and in the right hip. The SI joints appear corticated. IMPRESSION: No bowel obstruction.  No opaque calculi. Electronically Signed   By: Ivar Drape M.D.   On: 01/12/2017 08:43     Discharge Exam: Vitals:   01/16/17 0418 01/16/17 1000  BP: (!) 131/45 (!) 142/72  Pulse: 64 89  Resp: 16 20  Temp: 97.7 F (36.5 C) 97.5 F (36.4 C)   Vitals:   01/15/17 2004 01/16/17 0013 01/16/17 0418 01/16/17  1000  BP: (!) 130/55 136/64 (!) 131/45 (!) 142/72  Pulse: 64 83 64 89  Resp: 18 18 16 20   Temp: 97.9 F (36.6 C) 98.5 F (36.9 C) 97.7 F (36.5 C) 97.5 F (36.4 C)  TempSrc: Oral Oral Oral Oral  SpO2: 99% 95% 95% 96%  Weight: 101.2 kg (223 lb 1.6 oz)     Height:        General: Pt is alert, awake, not in acute distress, alert oriented x 4  Cardiovascular: RRR, S1/S2 +, no rubs, no gallops Respiratory: CTA bilaterally, no wheezing, no rhonchi Abdominal: Soft, NT, ND, bowel sounds + Extremities: 1+ LE edema, no cyanosis   The results of significant diagnostics from this hospitalization (including imaging, microbiology, ancillary and laboratory) are listed below for reference.     Microbiology: Recent Results (from the past 240 hour(s))  MRSA PCR Screening     Status: None   Collection Time: 01/12/17  3:57 AM  Result Value Ref Range Status   MRSA by PCR NEGATIVE NEGATIVE Final    Comment:        The GeneXpert MRSA Assay (FDA approved for NASAL specimens only), is one component of a comprehensive MRSA colonization surveillance program. It is not intended to diagnose MRSA infection nor to guide or monitor treatment for MRSA infections.   Urine culture     Status: Abnormal   Collection Time: 01/12/17  7:46 AM  Result Value Ref Range Status   Specimen Description URINE, RANDOM  Final   Special Requests NONE  Final   Culture 70,000 COLONIES/mL ENTEROCOCCUS FAECIUM (A)  Final   Report Status 01/14/2017 FINAL  Final   Organism ID, Bacteria ENTEROCOCCUS FAECIUM (A)  Final      Susceptibility   Enterococcus faecium - MIC*    AMPICILLIN >=32 RESISTANT Resistant     LEVOFLOXACIN >=8 RESISTANT Resistant     NITROFURANTOIN 128 RESISTANT Resistant     VANCOMYCIN <=0.5 SENSITIVE Sensitive     *  70,000 COLONIES/mL ENTEROCOCCUS FAECIUM  Respiratory Panel by PCR     Status: None   Collection Time: 01/12/17  8:51 AM  Result Value Ref Range Status   Adenovirus NOT DETECTED NOT  DETECTED Final   Coronavirus 229E NOT DETECTED NOT DETECTED Final   Coronavirus HKU1 NOT DETECTED NOT DETECTED Final   Coronavirus NL63 NOT DETECTED NOT DETECTED Final   Coronavirus OC43 NOT DETECTED NOT DETECTED Final   Metapneumovirus NOT DETECTED NOT DETECTED Final   Rhinovirus / Enterovirus NOT DETECTED NOT DETECTED Final   Influenza A NOT DETECTED NOT DETECTED Final   Influenza B NOT DETECTED NOT DETECTED Final   Parainfluenza Virus 1 NOT DETECTED NOT DETECTED Final   Parainfluenza Virus 2 NOT DETECTED NOT DETECTED Final   Parainfluenza Virus 3 NOT DETECTED NOT DETECTED Final   Parainfluenza Virus 4 NOT DETECTED NOT DETECTED Final   Respiratory Syncytial Virus NOT DETECTED NOT DETECTED Final   Bordetella pertussis NOT DETECTED NOT DETECTED Final   Chlamydophila pneumoniae NOT DETECTED NOT DETECTED Final   Mycoplasma pneumoniae NOT DETECTED NOT DETECTED Final  Culture, blood (Routine X 2) w Reflex to ID Panel     Status: None (Preliminary result)   Collection Time: 01/12/17  9:20 AM  Result Value Ref Range Status   Specimen Description BLOOD RIGHT HAND  Final   Special Requests IN PEDIATRIC BOTTLE 2CC  Final   Culture NO GROWTH 4 DAYS  Final   Report Status PENDING  Incomplete  Culture, blood (Routine X 2) w Reflex to ID Panel     Status: None (Preliminary result)   Collection Time: 01/12/17  9:40 AM  Result Value Ref Range Status   Specimen Description BLOOD RIGHT HAND  Final   Special Requests IN PEDIATRIC BOTTLE 2CC  Final   Culture NO GROWTH 4 DAYS  Final   Report Status PENDING  Incomplete     Labs: BNP (last 3 results) No results for input(s): BNP in the last 8760 hours. Basic Metabolic Panel:  Recent Labs Lab 01/12/17 0745 01/13/17 1148 01/14/17 0233 01/15/17 0316 01/16/17 0746  NA 145 149* 147* 149* 146*  K 6.2* 5.5* 5.2* 4.7 4.9  CL 110 117* 117* 120* 120*  CO2 25 24 23  21* 23  GLUCOSE 101* 103* 111* 183* 139*  BUN 109* 93* 80* 61* 41*  CREATININE 3.87*  2.71* 2.13* 1.54* 1.18*  CALCIUM 7.7* 8.1* 8.0* 7.9* 8.3*   Liver Function Tests:  Recent Labs Lab 01/12/17 0745 01/13/17 1148  AST 45* 56*  ALT 13* 18  ALKPHOS 63 73  BILITOT 1.2 1.3*  PROT 5.0* 5.7*  ALBUMIN 1.7* 1.8*   No results for input(s): LIPASE, AMYLASE in the last 168 hours. No results for input(s): AMMONIA in the last 168 hours. CBC:  Recent Labs Lab 01/12/17 0745 01/13/17 1148  WBC 10.9* 9.4  NEUTROABS 7.1  --   HGB 10.3* 11.1*  HCT 33.4* 38.0  MCV 97.7 100.0  PLT 288 186   Cardiac Enzymes:  Recent Labs Lab 01/12/17 0745  CKTOTAL 145   BNP: Invalid input(s): POCBNP CBG:  Recent Labs Lab 01/15/17 2007 01/16/17 0015 01/16/17 0418 01/16/17 0800 01/16/17 1150  GLUCAP 126* 162* 132* 143* 162*   D-Dimer No results for input(s): DDIMER in the last 72 hours. Hgb A1c No results for input(s): HGBA1C in the last 72 hours. Lipid Profile No results for input(s): CHOL, HDL, LDLCALC, TRIG, CHOLHDL, LDLDIRECT in the last 72 hours. Thyroid function studies No results for  input(s): TSH, T4TOTAL, T3FREE, THYROIDAB in the last 72 hours.  Invalid input(s): FREET3 Anemia work up No results for input(s): VITAMINB12, FOLATE, FERRITIN, TIBC, IRON, RETICCTPCT in the last 72 hours. Urinalysis    Component Value Date/Time   COLORURINE YELLOW 01/12/2017 0746   APPEARANCEUR HAZY (A) 01/12/2017 0746   LABSPEC 1.013 01/12/2017 0746   PHURINE 5.0 01/12/2017 0746   GLUCOSEU NEGATIVE 01/12/2017 0746   HGBUR NEGATIVE 01/12/2017 0746   BILIRUBINUR NEGATIVE 01/12/2017 0746   KETONESUR NEGATIVE 01/12/2017 0746   PROTEINUR NEGATIVE 01/12/2017 0746   NITRITE NEGATIVE 01/12/2017 0746   LEUKOCYTESUR MODERATE (A) 01/12/2017 0746   Sepsis Labs Invalid input(s): PROCALCITONIN,  WBC,  LACTICIDVEN Microbiology Recent Results (from the past 240 hour(s))  MRSA PCR Screening     Status: None   Collection Time: 01/12/17  3:57 AM  Result Value Ref Range Status   MRSA by  PCR NEGATIVE NEGATIVE Final    Comment:        The GeneXpert MRSA Assay (FDA approved for NASAL specimens only), is one component of a comprehensive MRSA colonization surveillance program. It is not intended to diagnose MRSA infection nor to guide or monitor treatment for MRSA infections.   Urine culture     Status: Abnormal   Collection Time: 01/12/17  7:46 AM  Result Value Ref Range Status   Specimen Description URINE, RANDOM  Final   Special Requests NONE  Final   Culture 70,000 COLONIES/mL ENTEROCOCCUS FAECIUM (A)  Final   Report Status 01/14/2017 FINAL  Final   Organism ID, Bacteria ENTEROCOCCUS FAECIUM (A)  Final      Susceptibility   Enterococcus faecium - MIC*    AMPICILLIN >=32 RESISTANT Resistant     LEVOFLOXACIN >=8 RESISTANT Resistant     NITROFURANTOIN 128 RESISTANT Resistant     VANCOMYCIN <=0.5 SENSITIVE Sensitive     * 70,000 COLONIES/mL ENTEROCOCCUS FAECIUM  Respiratory Panel by PCR     Status: None   Collection Time: 01/12/17  8:51 AM  Result Value Ref Range Status   Adenovirus NOT DETECTED NOT DETECTED Final   Coronavirus 229E NOT DETECTED NOT DETECTED Final   Coronavirus HKU1 NOT DETECTED NOT DETECTED Final   Coronavirus NL63 NOT DETECTED NOT DETECTED Final   Coronavirus OC43 NOT DETECTED NOT DETECTED Final   Metapneumovirus NOT DETECTED NOT DETECTED Final   Rhinovirus / Enterovirus NOT DETECTED NOT DETECTED Final   Influenza A NOT DETECTED NOT DETECTED Final   Influenza B NOT DETECTED NOT DETECTED Final   Parainfluenza Virus 1 NOT DETECTED NOT DETECTED Final   Parainfluenza Virus 2 NOT DETECTED NOT DETECTED Final   Parainfluenza Virus 3 NOT DETECTED NOT DETECTED Final   Parainfluenza Virus 4 NOT DETECTED NOT DETECTED Final   Respiratory Syncytial Virus NOT DETECTED NOT DETECTED Final   Bordetella pertussis NOT DETECTED NOT DETECTED Final   Chlamydophila pneumoniae NOT DETECTED NOT DETECTED Final   Mycoplasma pneumoniae NOT DETECTED NOT DETECTED Final   Culture, blood (Routine X 2) w Reflex to ID Panel     Status: None (Preliminary result)   Collection Time: 01/12/17  9:20 AM  Result Value Ref Range Status   Specimen Description BLOOD RIGHT HAND  Final   Special Requests IN PEDIATRIC BOTTLE 2CC  Final   Culture NO GROWTH 4 DAYS  Final   Report Status PENDING  Incomplete  Culture, blood (Routine X 2) w Reflex to ID Panel     Status: None (Preliminary result)   Collection Time:  01/12/17  9:40 AM  Result Value Ref Range Status   Specimen Description BLOOD RIGHT HAND  Final   Special Requests IN PEDIATRIC BOTTLE 2CC  Final   Culture NO GROWTH 4 DAYS  Final   Report Status PENDING  Incomplete     Time coordinating discharge: Over 40 minutes  SIGNED:  Chipper Oman, MD  [facility] 01/16/2017, 3:49 PM  Pager please text page via  [email or web address] Password [email or web address]

## 2017-01-16 NOTE — Progress Notes (Addendum)
Report called to [facility] [place]. Nurse Katharine Look LPN will be receiving patient at facility and was notiifed pt has a wound vac. Pts belongings gathered and placed in bags Transport will be called after patient has finished evening meal.

## 2017-01-16 NOTE — Progress Notes (Signed)
ANTICOAGULATION CONSULT NOTE - Follow Up Consult  Pharmacy Consult for Coumadin Indication: atrial fibrillation  No Known Allergies  Patient Measurements: Height: 5\' 3"  (160 cm) Weight: 223 lb 1.6 oz (101.2 kg) IBW/kg (Calculated) : 52.4  Vital Signs: Temp: 97.5 F (36.4 C) (02/26 1000) Temp Source: Oral (02/26 1000) BP: 142/72 (02/26 1000) Pulse Rate: 89 (02/26 1000)  Labs:  Recent Labs  01/13/17 1148 01/14/17 0233 01/15/17 0316 01/16/17 0746  HGB 11.1*  --   --   --   HCT 38.0  --   --   --   PLT 186  --   --   --   LABPROT 25.3* 23.5* 28.8* 26.7*  INR 2.26 2.05 2.65 2.41  CREATININE 2.71* 2.13* 1.54* 1.18*    Estimated Creatinine Clearance: 46 mL/min (by C-G formula based on SCr of 1.18 mg/dL (H)).   Medications:  Scheduled:  . ceFEPime (MAXIPIME) IV  1 g Intravenous Q24H  . insulin aspart  0-9 Units Subcutaneous Q4H  . levothyroxine  25 mcg Oral QAC breakfast  . mouth rinse  15 mL Mouth Rinse BID  . pravastatin  40 mg Oral q1800  . sodium chloride flush  3 mL Intravenous Q12H  . vancomycin  1,250 mg Intravenous Q24H  . Warfarin - Pharmacist Dosing Inpatient   Does not apply q1800    Assessment: 76yo female with AFib, digoxin currently on hold due to increased Cr.  I have called and verified with RN at [facility] that pt was not on Coumadin pta as it had been d/c'd on 2/12.  Her INR remained > 2 on admission on 2/22.  I have discussed this with Dr Quincy Simmonds and he will evaluate. INR therapeutic today.  INR 2.41 and no CBC today.  No bleeding noted.  Goal of Therapy:  INR 2-3 Monitor platelets by anticoagulation protocol: Yes   Plan:  Coumadin 1mg  today Daily INR Watch for s/s of bleeding   Gracy Bruins, PharmD [facility]

## 2017-01-16 NOTE — Progress Notes (Signed)
  Speech Language Pathology Treatment: Dysphagia  Patient Details Name: Danielle Woodard MRN: [id number] DOB: 1940-12-05 Today's Date: 01/16/2017 Time: 3491-7915 SLP Time Calculation (min) (ACUTE ONLY): 14 min  Assessment / Plan / Recommendation Clinical Impression  Pt seen for dysphagia treatment during breakfast meal. Esophageal component suspected from initial assessment due to cough primarily post prandial. Immediate cough following thin with first trial and delayed cough x 1. Pt states she coughs sometimes but not often. No other s/s aspiration notes. SLP educated re: general aspiration and esophageal precautions. No further ST needed. If difficulties arise, please re consult.   HPI HPI: 77 y.o. female who presents as a transfer from outside facility due to possible need for dialysis as renal funciton deteriorating and now w/ encehpalopathy likely from uremia and now with AKI w/ CKD likely in part from dehydration RLE wound w/ wound vac and sacral decubitus ulcerations      SLP Plan  All goals met       Recommendations  Diet recommendations: Thin liquid (soft) Liquids provided via: Cup Medication Administration: Whole meds with liquid Supervision: Patient able to self feed Compensations: Slow rate;Small sips/bites;Follow solids with liquid Postural Changes and/or Swallow Maneuvers: Seated upright 90 degrees;Upright 30-60 min after meal                Oral Care Recommendations: Oral care BID Follow up Recommendations: None SLP Visit Diagnosis: Dysphagia, unspecified (R13.10) Plan: All goals met                      Houston Siren 01/16/2017, 9:35 AM  Orbie Pyo Colvin Caroli.Ed [employer] [phone]

## 2017-01-17 DIAGNOSIS — I129 Hypertensive chronic kidney disease with stage 1 through stage 4 chronic kidney disease, or unspecified chronic kidney disease: Secondary | ICD-10-CM | POA: Diagnosis not present

## 2017-01-17 DIAGNOSIS — N390 Urinary tract infection, site not specified: Secondary | ICD-10-CM | POA: Diagnosis not present

## 2017-01-17 DIAGNOSIS — E1140 Type 2 diabetes mellitus with diabetic neuropathy, unspecified: Secondary | ICD-10-CM | POA: Diagnosis not present

## 2017-01-17 DIAGNOSIS — N184 Chronic kidney disease, stage 4 (severe): Secondary | ICD-10-CM | POA: Diagnosis not present

## 2017-01-17 LAB — CULTURE, BLOOD (ROUTINE X 2)
Culture: NO GROWTH
Culture: NO GROWTH

## 2017-01-18 ENCOUNTER — Other Ambulatory Visit: Payer: Self-pay | Admitting: *Deleted

## 2017-01-18 DIAGNOSIS — S8261XD Displaced fracture of lateral malleolus of right fibula, subsequent encounter for closed fracture with routine healing: Secondary | ICD-10-CM | POA: Diagnosis not present

## 2017-01-18 NOTE — Patient Outreach (Signed)
[place] [facility]) Care Management  01/18/2017  Koryn Charlot Dec 19, 1940 [id number]   [place] [facility]) Care Management Post-Acute Care Coordination  01/18/2017  Tynasia Mccaul July 17, 1941 [id number]   Met with admissions director, SW not available today. Reviewed patient case. She confirms that patient will transition to a [place] resident at facility and there are no plans to discharge at this time.  RNCM will sign off case as not [facility] care management needs assessed.  Royetta Crochet. Laymond Purser, RN, BSN, [place] Post-Acute Care Coordinator [phone]

## 2017-01-19 DIAGNOSIS — E119 Type 2 diabetes mellitus without complications: Secondary | ICD-10-CM | POA: Diagnosis not present

## 2017-01-19 DIAGNOSIS — Z79899 Other long term (current) drug therapy: Secondary | ICD-10-CM | POA: Diagnosis not present

## 2017-01-23 DIAGNOSIS — Z79899 Other long term (current) drug therapy: Secondary | ICD-10-CM | POA: Diagnosis not present

## 2017-01-30 DIAGNOSIS — F321 Major depressive disorder, single episode, moderate: Secondary | ICD-10-CM | POA: Diagnosis not present

## 2017-02-07 DIAGNOSIS — M25571 Pain in right ankle and joints of right foot: Secondary | ICD-10-CM | POA: Diagnosis not present

## 2017-02-07 DIAGNOSIS — Z967 Presence of other bone and tendon implants: Secondary | ICD-10-CM | POA: Diagnosis not present

## 2017-02-07 DIAGNOSIS — M79671 Pain in right foot: Secondary | ICD-10-CM | POA: Diagnosis not present

## 2017-02-07 DIAGNOSIS — S91001S Unspecified open wound, right ankle, sequela: Secondary | ICD-10-CM | POA: Diagnosis not present

## 2017-02-08 DIAGNOSIS — I129 Hypertensive chronic kidney disease with stage 1 through stage 4 chronic kidney disease, or unspecified chronic kidney disease: Secondary | ICD-10-CM | POA: Diagnosis not present

## 2017-02-08 DIAGNOSIS — I70233 Atherosclerosis of native arteries of right leg with ulceration of ankle: Secondary | ICD-10-CM | POA: Diagnosis not present

## 2017-02-08 DIAGNOSIS — N183 Chronic kidney disease, stage 3 (moderate): Secondary | ICD-10-CM | POA: Diagnosis not present

## 2017-02-08 DIAGNOSIS — Z87891 Personal history of nicotine dependence: Secondary | ICD-10-CM | POA: Diagnosis not present

## 2017-02-08 DIAGNOSIS — E1122 Type 2 diabetes mellitus with diabetic chronic kidney disease: Secondary | ICD-10-CM | POA: Diagnosis not present

## 2017-02-08 DIAGNOSIS — Z7982 Long term (current) use of aspirin: Secondary | ICD-10-CM | POA: Diagnosis not present

## 2017-02-08 DIAGNOSIS — Z7901 Long term (current) use of anticoagulants: Secondary | ICD-10-CM | POA: Diagnosis not present

## 2017-02-10 DIAGNOSIS — Z8781 Personal history of (healed) traumatic fracture: Secondary | ICD-10-CM | POA: Diagnosis not present

## 2017-02-10 DIAGNOSIS — F321 Major depressive disorder, single episode, moderate: Secondary | ICD-10-CM | POA: Diagnosis not present

## 2017-02-10 DIAGNOSIS — E119 Type 2 diabetes mellitus without complications: Secondary | ICD-10-CM | POA: Diagnosis not present

## 2017-02-10 DIAGNOSIS — S91001S Unspecified open wound, right ankle, sequela: Secondary | ICD-10-CM | POA: Diagnosis not present

## 2017-02-10 DIAGNOSIS — Z967 Presence of other bone and tendon implants: Secondary | ICD-10-CM | POA: Diagnosis not present

## 2017-02-13 DIAGNOSIS — R195 Other fecal abnormalities: Secondary | ICD-10-CM | POA: Diagnosis not present

## 2017-02-13 DIAGNOSIS — T8131XS Disruption of external operation (surgical) wound, not elsewhere classified, sequela: Secondary | ICD-10-CM | POA: Diagnosis not present

## 2017-02-13 DIAGNOSIS — Z01818 Encounter for other preprocedural examination: Secondary | ICD-10-CM | POA: Diagnosis not present

## 2017-02-13 DIAGNOSIS — I10 Essential (primary) hypertension: Secondary | ICD-10-CM | POA: Diagnosis not present

## 2017-02-14 DIAGNOSIS — Z7901 Long term (current) use of anticoagulants: Secondary | ICD-10-CM | POA: Diagnosis not present

## 2017-02-14 DIAGNOSIS — N390 Urinary tract infection, site not specified: Secondary | ICD-10-CM | POA: Diagnosis not present

## 2017-02-15 DIAGNOSIS — Z8781 Personal history of (healed) traumatic fracture: Secondary | ICD-10-CM | POA: Diagnosis not present

## 2017-02-15 DIAGNOSIS — M87871 Other osteonecrosis, right ankle: Secondary | ICD-10-CM | POA: Diagnosis not present

## 2017-02-15 DIAGNOSIS — Z967 Presence of other bone and tendon implants: Secondary | ICD-10-CM | POA: Diagnosis not present

## 2017-02-15 DIAGNOSIS — T8489XA Other specified complication of internal orthopedic prosthetic devices, implants and grafts, initial encounter: Secondary | ICD-10-CM | POA: Diagnosis not present

## 2017-02-15 DIAGNOSIS — N189 Chronic kidney disease, unspecified: Secondary | ICD-10-CM | POA: Diagnosis not present

## 2017-02-15 DIAGNOSIS — Z79899 Other long term (current) drug therapy: Secondary | ICD-10-CM | POA: Diagnosis not present

## 2017-02-15 DIAGNOSIS — S91001A Unspecified open wound, right ankle, initial encounter: Secondary | ICD-10-CM | POA: Diagnosis not present

## 2017-02-15 DIAGNOSIS — M25473 Effusion, unspecified ankle: Secondary | ICD-10-CM | POA: Diagnosis not present

## 2017-02-15 DIAGNOSIS — M879 Osteonecrosis, unspecified: Secondary | ICD-10-CM | POA: Diagnosis not present

## 2017-02-15 DIAGNOSIS — I130 Hypertensive heart and chronic kidney disease with heart failure and stage 1 through stage 4 chronic kidney disease, or unspecified chronic kidney disease: Secondary | ICD-10-CM | POA: Diagnosis not present

## 2017-02-15 DIAGNOSIS — I4891 Unspecified atrial fibrillation: Secondary | ICD-10-CM | POA: Diagnosis not present

## 2017-02-15 DIAGNOSIS — S90929A Unspecified superficial injury of unspecified foot, initial encounter: Secondary | ICD-10-CM | POA: Diagnosis not present

## 2017-02-15 DIAGNOSIS — I5040 Unspecified combined systolic (congestive) and diastolic (congestive) heart failure: Secondary | ICD-10-CM | POA: Diagnosis not present

## 2017-02-15 DIAGNOSIS — S91001S Unspecified open wound, right ankle, sequela: Secondary | ICD-10-CM | POA: Diagnosis not present

## 2017-02-15 DIAGNOSIS — E1122 Type 2 diabetes mellitus with diabetic chronic kidney disease: Secondary | ICD-10-CM | POA: Diagnosis not present

## 2017-02-15 DIAGNOSIS — R9431 Abnormal electrocardiogram [ECG] [EKG]: Secondary | ICD-10-CM | POA: Diagnosis not present

## 2017-02-15 DIAGNOSIS — Z472 Encounter for removal of internal fixation device: Secondary | ICD-10-CM | POA: Diagnosis not present

## 2017-02-17 DIAGNOSIS — F321 Major depressive disorder, single episode, moderate: Secondary | ICD-10-CM | POA: Diagnosis not present

## 2017-02-21 DIAGNOSIS — R293 Abnormal posture: Secondary | ICD-10-CM | POA: Diagnosis not present

## 2017-02-21 DIAGNOSIS — N183 Chronic kidney disease, stage 3 (moderate): Secondary | ICD-10-CM | POA: Diagnosis not present

## 2017-02-21 DIAGNOSIS — E1140 Type 2 diabetes mellitus with diabetic neuropathy, unspecified: Secondary | ICD-10-CM | POA: Diagnosis not present

## 2017-02-21 DIAGNOSIS — M6281 Muscle weakness (generalized): Secondary | ICD-10-CM | POA: Diagnosis not present

## 2017-02-21 DIAGNOSIS — J9611 Chronic respiratory failure with hypoxia: Secondary | ICD-10-CM | POA: Diagnosis not present

## 2017-02-24 DIAGNOSIS — E1140 Type 2 diabetes mellitus with diabetic neuropathy, unspecified: Secondary | ICD-10-CM | POA: Diagnosis not present

## 2017-02-24 DIAGNOSIS — N183 Chronic kidney disease, stage 3 (moderate): Secondary | ICD-10-CM | POA: Diagnosis not present

## 2017-02-24 DIAGNOSIS — R293 Abnormal posture: Secondary | ICD-10-CM | POA: Diagnosis not present

## 2017-02-24 DIAGNOSIS — M6281 Muscle weakness (generalized): Secondary | ICD-10-CM | POA: Diagnosis not present

## 2017-02-24 DIAGNOSIS — J9611 Chronic respiratory failure with hypoxia: Secondary | ICD-10-CM | POA: Diagnosis not present

## 2017-02-24 DIAGNOSIS — F321 Major depressive disorder, single episode, moderate: Secondary | ICD-10-CM | POA: Diagnosis not present

## 2017-02-26 DIAGNOSIS — R197 Diarrhea, unspecified: Secondary | ICD-10-CM | POA: Diagnosis not present

## 2017-02-27 DIAGNOSIS — N183 Chronic kidney disease, stage 3 (moderate): Secondary | ICD-10-CM | POA: Diagnosis not present

## 2017-02-27 DIAGNOSIS — M6281 Muscle weakness (generalized): Secondary | ICD-10-CM | POA: Diagnosis not present

## 2017-02-27 DIAGNOSIS — A0472 Enterocolitis due to Clostridium difficile, not specified as recurrent: Secondary | ICD-10-CM | POA: Diagnosis not present

## 2017-02-27 DIAGNOSIS — J9611 Chronic respiratory failure with hypoxia: Secondary | ICD-10-CM | POA: Diagnosis not present

## 2017-02-27 DIAGNOSIS — R293 Abnormal posture: Secondary | ICD-10-CM | POA: Diagnosis not present

## 2017-02-27 DIAGNOSIS — E1140 Type 2 diabetes mellitus with diabetic neuropathy, unspecified: Secondary | ICD-10-CM | POA: Diagnosis not present

## 2017-02-28 DIAGNOSIS — Z79899 Other long term (current) drug therapy: Secondary | ICD-10-CM | POA: Diagnosis not present

## 2017-02-28 DIAGNOSIS — E039 Hypothyroidism, unspecified: Secondary | ICD-10-CM | POA: Diagnosis not present

## 2017-02-28 DIAGNOSIS — J9611 Chronic respiratory failure with hypoxia: Secondary | ICD-10-CM | POA: Diagnosis not present

## 2017-02-28 DIAGNOSIS — M6281 Muscle weakness (generalized): Secondary | ICD-10-CM | POA: Diagnosis not present

## 2017-02-28 DIAGNOSIS — J449 Chronic obstructive pulmonary disease, unspecified: Secondary | ICD-10-CM | POA: Diagnosis not present

## 2017-02-28 DIAGNOSIS — Z9049 Acquired absence of other specified parts of digestive tract: Secondary | ICD-10-CM | POA: Diagnosis not present

## 2017-02-28 DIAGNOSIS — S0081XA Abrasion of other part of head, initial encounter: Secondary | ICD-10-CM | POA: Diagnosis not present

## 2017-02-28 DIAGNOSIS — S0083XA Contusion of other part of head, initial encounter: Secondary | ICD-10-CM | POA: Diagnosis not present

## 2017-02-28 DIAGNOSIS — I130 Hypertensive heart and chronic kidney disease with heart failure and stage 1 through stage 4 chronic kidney disease, or unspecified chronic kidney disease: Secondary | ICD-10-CM | POA: Diagnosis not present

## 2017-02-28 DIAGNOSIS — E1140 Type 2 diabetes mellitus with diabetic neuropathy, unspecified: Secondary | ICD-10-CM | POA: Diagnosis not present

## 2017-02-28 DIAGNOSIS — E1122 Type 2 diabetes mellitus with diabetic chronic kidney disease: Secondary | ICD-10-CM | POA: Diagnosis not present

## 2017-02-28 DIAGNOSIS — I509 Heart failure, unspecified: Secondary | ICD-10-CM | POA: Diagnosis not present

## 2017-02-28 DIAGNOSIS — S0993XA Unspecified injury of face, initial encounter: Secondary | ICD-10-CM | POA: Diagnosis not present

## 2017-02-28 DIAGNOSIS — I4891 Unspecified atrial fibrillation: Secondary | ICD-10-CM | POA: Diagnosis not present

## 2017-02-28 DIAGNOSIS — Z9889 Other specified postprocedural states: Secondary | ICD-10-CM | POA: Diagnosis not present

## 2017-02-28 DIAGNOSIS — R293 Abnormal posture: Secondary | ICD-10-CM | POA: Diagnosis not present

## 2017-02-28 DIAGNOSIS — N183 Chronic kidney disease, stage 3 (moderate): Secondary | ICD-10-CM | POA: Diagnosis not present

## 2017-02-28 DIAGNOSIS — S0990XA Unspecified injury of head, initial encounter: Secondary | ICD-10-CM | POA: Diagnosis not present

## 2017-02-28 DIAGNOSIS — S3982XA Other specified injuries of lower back, initial encounter: Secondary | ICD-10-CM | POA: Diagnosis not present

## 2017-02-28 DIAGNOSIS — G8911 Acute pain due to trauma: Secondary | ICD-10-CM | POA: Diagnosis not present

## 2017-03-01 DIAGNOSIS — E1140 Type 2 diabetes mellitus with diabetic neuropathy, unspecified: Secondary | ICD-10-CM | POA: Diagnosis not present

## 2017-03-01 DIAGNOSIS — N183 Chronic kidney disease, stage 3 (moderate): Secondary | ICD-10-CM | POA: Diagnosis not present

## 2017-03-01 DIAGNOSIS — M6281 Muscle weakness (generalized): Secondary | ICD-10-CM | POA: Diagnosis not present

## 2017-03-01 DIAGNOSIS — R293 Abnormal posture: Secondary | ICD-10-CM | POA: Diagnosis not present

## 2017-03-01 DIAGNOSIS — J9611 Chronic respiratory failure with hypoxia: Secondary | ICD-10-CM | POA: Diagnosis not present

## 2017-03-02 DIAGNOSIS — M6281 Muscle weakness (generalized): Secondary | ICD-10-CM | POA: Diagnosis not present

## 2017-03-02 DIAGNOSIS — N183 Chronic kidney disease, stage 3 (moderate): Secondary | ICD-10-CM | POA: Diagnosis not present

## 2017-03-02 DIAGNOSIS — R293 Abnormal posture: Secondary | ICD-10-CM | POA: Diagnosis not present

## 2017-03-02 DIAGNOSIS — J9611 Chronic respiratory failure with hypoxia: Secondary | ICD-10-CM | POA: Diagnosis not present

## 2017-03-02 DIAGNOSIS — E1140 Type 2 diabetes mellitus with diabetic neuropathy, unspecified: Secondary | ICD-10-CM | POA: Diagnosis not present

## 2017-03-03 DIAGNOSIS — F321 Major depressive disorder, single episode, moderate: Secondary | ICD-10-CM | POA: Diagnosis not present

## 2017-03-07 DIAGNOSIS — R293 Abnormal posture: Secondary | ICD-10-CM | POA: Diagnosis not present

## 2017-03-07 DIAGNOSIS — M6281 Muscle weakness (generalized): Secondary | ICD-10-CM | POA: Diagnosis not present

## 2017-03-07 DIAGNOSIS — N183 Chronic kidney disease, stage 3 (moderate): Secondary | ICD-10-CM | POA: Diagnosis not present

## 2017-03-07 DIAGNOSIS — J9611 Chronic respiratory failure with hypoxia: Secondary | ICD-10-CM | POA: Diagnosis not present

## 2017-03-07 DIAGNOSIS — E1140 Type 2 diabetes mellitus with diabetic neuropathy, unspecified: Secondary | ICD-10-CM | POA: Diagnosis not present

## 2017-03-08 DIAGNOSIS — E1140 Type 2 diabetes mellitus with diabetic neuropathy, unspecified: Secondary | ICD-10-CM | POA: Diagnosis not present

## 2017-03-08 DIAGNOSIS — S91001A Unspecified open wound, right ankle, initial encounter: Secondary | ICD-10-CM | POA: Diagnosis not present

## 2017-03-08 DIAGNOSIS — I4891 Unspecified atrial fibrillation: Secondary | ICD-10-CM | POA: Diagnosis not present

## 2017-03-08 DIAGNOSIS — T8189XA Other complications of procedures, not elsewhere classified, initial encounter: Secondary | ICD-10-CM | POA: Diagnosis not present

## 2017-03-08 DIAGNOSIS — G4730 Sleep apnea, unspecified: Secondary | ICD-10-CM | POA: Diagnosis not present

## 2017-03-08 DIAGNOSIS — I509 Heart failure, unspecified: Secondary | ICD-10-CM | POA: Diagnosis not present

## 2017-03-08 DIAGNOSIS — I110 Hypertensive heart disease with heart failure: Secondary | ICD-10-CM | POA: Diagnosis not present

## 2017-03-08 DIAGNOSIS — E11628 Type 2 diabetes mellitus with other skin complications: Secondary | ICD-10-CM | POA: Diagnosis not present

## 2017-03-08 DIAGNOSIS — K219 Gastro-esophageal reflux disease without esophagitis: Secondary | ICD-10-CM | POA: Diagnosis not present

## 2017-03-09 DIAGNOSIS — N183 Chronic kidney disease, stage 3 (moderate): Secondary | ICD-10-CM | POA: Diagnosis not present

## 2017-03-09 DIAGNOSIS — F0390 Unspecified dementia without behavioral disturbance: Secondary | ICD-10-CM | POA: Diagnosis not present

## 2017-03-09 DIAGNOSIS — M6281 Muscle weakness (generalized): Secondary | ICD-10-CM | POA: Diagnosis not present

## 2017-03-09 DIAGNOSIS — R293 Abnormal posture: Secondary | ICD-10-CM | POA: Diagnosis not present

## 2017-03-09 DIAGNOSIS — E1140 Type 2 diabetes mellitus with diabetic neuropathy, unspecified: Secondary | ICD-10-CM | POA: Diagnosis not present

## 2017-03-09 DIAGNOSIS — J9611 Chronic respiratory failure with hypoxia: Secondary | ICD-10-CM | POA: Diagnosis not present

## 2017-03-10 DIAGNOSIS — F321 Major depressive disorder, single episode, moderate: Secondary | ICD-10-CM | POA: Diagnosis not present

## 2017-03-13 DIAGNOSIS — E11628 Type 2 diabetes mellitus with other skin complications: Secondary | ICD-10-CM | POA: Diagnosis not present

## 2017-03-13 DIAGNOSIS — N183 Chronic kidney disease, stage 3 (moderate): Secondary | ICD-10-CM | POA: Diagnosis not present

## 2017-03-13 DIAGNOSIS — E1140 Type 2 diabetes mellitus with diabetic neuropathy, unspecified: Secondary | ICD-10-CM | POA: Diagnosis not present

## 2017-03-13 DIAGNOSIS — S91001A Unspecified open wound, right ankle, initial encounter: Secondary | ICD-10-CM | POA: Diagnosis not present

## 2017-03-13 DIAGNOSIS — R293 Abnormal posture: Secondary | ICD-10-CM | POA: Diagnosis not present

## 2017-03-13 DIAGNOSIS — J9611 Chronic respiratory failure with hypoxia: Secondary | ICD-10-CM | POA: Diagnosis not present

## 2017-03-13 DIAGNOSIS — M6281 Muscle weakness (generalized): Secondary | ICD-10-CM | POA: Diagnosis not present

## 2017-03-13 DIAGNOSIS — T8189XA Other complications of procedures, not elsewhere classified, initial encounter: Secondary | ICD-10-CM | POA: Diagnosis not present

## 2017-03-15 DIAGNOSIS — N183 Chronic kidney disease, stage 3 (moderate): Secondary | ICD-10-CM | POA: Diagnosis not present

## 2017-03-15 DIAGNOSIS — J9611 Chronic respiratory failure with hypoxia: Secondary | ICD-10-CM | POA: Diagnosis not present

## 2017-03-15 DIAGNOSIS — R293 Abnormal posture: Secondary | ICD-10-CM | POA: Diagnosis not present

## 2017-03-15 DIAGNOSIS — E1140 Type 2 diabetes mellitus with diabetic neuropathy, unspecified: Secondary | ICD-10-CM | POA: Diagnosis not present

## 2017-03-15 DIAGNOSIS — M6281 Muscle weakness (generalized): Secondary | ICD-10-CM | POA: Diagnosis not present

## 2017-03-16 DIAGNOSIS — B351 Tinea unguium: Secondary | ICD-10-CM | POA: Diagnosis not present

## 2017-03-16 DIAGNOSIS — M79675 Pain in left toe(s): Secondary | ICD-10-CM | POA: Diagnosis not present

## 2017-03-16 DIAGNOSIS — M79674 Pain in right toe(s): Secondary | ICD-10-CM | POA: Diagnosis not present

## 2017-03-17 DIAGNOSIS — R293 Abnormal posture: Secondary | ICD-10-CM | POA: Diagnosis not present

## 2017-03-17 DIAGNOSIS — E1140 Type 2 diabetes mellitus with diabetic neuropathy, unspecified: Secondary | ICD-10-CM | POA: Diagnosis not present

## 2017-03-17 DIAGNOSIS — N183 Chronic kidney disease, stage 3 (moderate): Secondary | ICD-10-CM | POA: Diagnosis not present

## 2017-03-17 DIAGNOSIS — M6281 Muscle weakness (generalized): Secondary | ICD-10-CM | POA: Diagnosis not present

## 2017-03-17 DIAGNOSIS — J9611 Chronic respiratory failure with hypoxia: Secondary | ICD-10-CM | POA: Diagnosis not present

## 2017-03-20 DIAGNOSIS — R293 Abnormal posture: Secondary | ICD-10-CM | POA: Diagnosis not present

## 2017-03-20 DIAGNOSIS — N183 Chronic kidney disease, stage 3 (moderate): Secondary | ICD-10-CM | POA: Diagnosis not present

## 2017-03-20 DIAGNOSIS — J9611 Chronic respiratory failure with hypoxia: Secondary | ICD-10-CM | POA: Diagnosis not present

## 2017-03-20 DIAGNOSIS — M6281 Muscle weakness (generalized): Secondary | ICD-10-CM | POA: Diagnosis not present

## 2017-03-20 DIAGNOSIS — E1140 Type 2 diabetes mellitus with diabetic neuropathy, unspecified: Secondary | ICD-10-CM | POA: Diagnosis not present

## 2017-03-22 DIAGNOSIS — E1140 Type 2 diabetes mellitus with diabetic neuropathy, unspecified: Secondary | ICD-10-CM | POA: Diagnosis not present

## 2017-03-22 DIAGNOSIS — N183 Chronic kidney disease, stage 3 (moderate): Secondary | ICD-10-CM | POA: Diagnosis not present

## 2017-03-22 DIAGNOSIS — R293 Abnormal posture: Secondary | ICD-10-CM | POA: Diagnosis not present

## 2017-03-22 DIAGNOSIS — J9611 Chronic respiratory failure with hypoxia: Secondary | ICD-10-CM | POA: Diagnosis not present

## 2017-03-22 DIAGNOSIS — M6281 Muscle weakness (generalized): Secondary | ICD-10-CM | POA: Diagnosis not present

## 2017-03-22 DIAGNOSIS — Z7409 Other reduced mobility: Secondary | ICD-10-CM | POA: Diagnosis not present

## 2017-03-23 DIAGNOSIS — E1140 Type 2 diabetes mellitus with diabetic neuropathy, unspecified: Secondary | ICD-10-CM | POA: Diagnosis not present

## 2017-03-23 DIAGNOSIS — N183 Chronic kidney disease, stage 3 (moderate): Secondary | ICD-10-CM | POA: Diagnosis not present

## 2017-03-23 DIAGNOSIS — J9611 Chronic respiratory failure with hypoxia: Secondary | ICD-10-CM | POA: Diagnosis not present

## 2017-03-23 DIAGNOSIS — Z7409 Other reduced mobility: Secondary | ICD-10-CM | POA: Diagnosis not present

## 2017-03-23 DIAGNOSIS — R293 Abnormal posture: Secondary | ICD-10-CM | POA: Diagnosis not present

## 2017-03-23 DIAGNOSIS — M6281 Muscle weakness (generalized): Secondary | ICD-10-CM | POA: Diagnosis not present

## 2017-03-24 DIAGNOSIS — L97812 Non-pressure chronic ulcer of other part of right lower leg with fat layer exposed: Secondary | ICD-10-CM | POA: Diagnosis not present

## 2017-03-24 DIAGNOSIS — E1140 Type 2 diabetes mellitus with diabetic neuropathy, unspecified: Secondary | ICD-10-CM | POA: Diagnosis not present

## 2017-03-24 DIAGNOSIS — S91001A Unspecified open wound, right ankle, initial encounter: Secondary | ICD-10-CM | POA: Diagnosis not present

## 2017-03-24 DIAGNOSIS — E11628 Type 2 diabetes mellitus with other skin complications: Secondary | ICD-10-CM | POA: Diagnosis not present

## 2017-03-28 DIAGNOSIS — F321 Major depressive disorder, single episode, moderate: Secondary | ICD-10-CM | POA: Diagnosis not present

## 2017-03-29 DIAGNOSIS — Z1389 Encounter for screening for other disorder: Secondary | ICD-10-CM | POA: Diagnosis not present

## 2017-03-29 DIAGNOSIS — Z0000 Encounter for general adult medical examination without abnormal findings: Secondary | ICD-10-CM | POA: Diagnosis not present

## 2017-03-29 DIAGNOSIS — Z139 Encounter for screening, unspecified: Secondary | ICD-10-CM | POA: Diagnosis not present

## 2017-03-31 DIAGNOSIS — F321 Major depressive disorder, single episode, moderate: Secondary | ICD-10-CM | POA: Diagnosis not present

## 2017-04-07 DIAGNOSIS — E11628 Type 2 diabetes mellitus with other skin complications: Secondary | ICD-10-CM | POA: Diagnosis not present

## 2017-04-07 DIAGNOSIS — E1140 Type 2 diabetes mellitus with diabetic neuropathy, unspecified: Secondary | ICD-10-CM | POA: Diagnosis not present

## 2017-04-07 DIAGNOSIS — T8189XA Other complications of procedures, not elsewhere classified, initial encounter: Secondary | ICD-10-CM | POA: Diagnosis not present

## 2017-04-07 DIAGNOSIS — S91001D Unspecified open wound, right ankle, subsequent encounter: Secondary | ICD-10-CM | POA: Diagnosis not present

## 2017-04-07 DIAGNOSIS — F321 Major depressive disorder, single episode, moderate: Secondary | ICD-10-CM | POA: Diagnosis not present

## 2017-04-14 DIAGNOSIS — F321 Major depressive disorder, single episode, moderate: Secondary | ICD-10-CM | POA: Diagnosis not present

## 2017-04-14 DIAGNOSIS — S91001D Unspecified open wound, right ankle, subsequent encounter: Secondary | ICD-10-CM | POA: Diagnosis not present

## 2017-04-14 DIAGNOSIS — T8189XA Other complications of procedures, not elsewhere classified, initial encounter: Secondary | ICD-10-CM | POA: Diagnosis not present

## 2017-04-21 DIAGNOSIS — T8189XA Other complications of procedures, not elsewhere classified, initial encounter: Secondary | ICD-10-CM | POA: Diagnosis not present

## 2017-04-21 DIAGNOSIS — S91001D Unspecified open wound, right ankle, subsequent encounter: Secondary | ICD-10-CM | POA: Diagnosis not present

## 2017-04-28 DIAGNOSIS — S81801A Unspecified open wound, right lower leg, initial encounter: Secondary | ICD-10-CM | POA: Diagnosis not present

## 2017-04-28 DIAGNOSIS — L97316 Non-pressure chronic ulcer of right ankle with bone involvement without evidence of necrosis: Secondary | ICD-10-CM | POA: Diagnosis not present

## 2017-05-05 DIAGNOSIS — T8189XA Other complications of procedures, not elsewhere classified, initial encounter: Secondary | ICD-10-CM | POA: Diagnosis not present

## 2017-05-05 DIAGNOSIS — S91001D Unspecified open wound, right ankle, subsequent encounter: Secondary | ICD-10-CM | POA: Diagnosis not present

## 2017-05-05 DIAGNOSIS — F321 Major depressive disorder, single episode, moderate: Secondary | ICD-10-CM | POA: Diagnosis not present

## 2017-05-12 DIAGNOSIS — F321 Major depressive disorder, single episode, moderate: Secondary | ICD-10-CM | POA: Diagnosis not present

## 2017-05-19 DIAGNOSIS — S91001A Unspecified open wound, right ankle, initial encounter: Secondary | ICD-10-CM | POA: Diagnosis not present

## 2017-05-19 DIAGNOSIS — T8189XA Other complications of procedures, not elsewhere classified, initial encounter: Secondary | ICD-10-CM | POA: Diagnosis not present

## 2017-05-19 DIAGNOSIS — F321 Major depressive disorder, single episode, moderate: Secondary | ICD-10-CM | POA: Diagnosis not present

## 2017-05-22 DIAGNOSIS — Z79899 Other long term (current) drug therapy: Secondary | ICD-10-CM | POA: Diagnosis not present

## 2017-05-22 DIAGNOSIS — E785 Hyperlipidemia, unspecified: Secondary | ICD-10-CM | POA: Diagnosis not present

## 2017-05-26 DIAGNOSIS — F321 Major depressive disorder, single episode, moderate: Secondary | ICD-10-CM | POA: Diagnosis not present

## 2017-05-30 DIAGNOSIS — F321 Major depressive disorder, single episode, moderate: Secondary | ICD-10-CM | POA: Diagnosis not present

## 2017-06-01 DIAGNOSIS — I509 Heart failure, unspecified: Secondary | ICD-10-CM | POA: Diagnosis not present

## 2017-06-01 DIAGNOSIS — I10 Essential (primary) hypertension: Secondary | ICD-10-CM | POA: Diagnosis not present

## 2017-06-01 DIAGNOSIS — E1140 Type 2 diabetes mellitus with diabetic neuropathy, unspecified: Secondary | ICD-10-CM | POA: Diagnosis not present

## 2017-06-02 DIAGNOSIS — T8189XA Other complications of procedures, not elsewhere classified, initial encounter: Secondary | ICD-10-CM | POA: Diagnosis not present

## 2017-06-02 DIAGNOSIS — S91001D Unspecified open wound, right ankle, subsequent encounter: Secondary | ICD-10-CM | POA: Diagnosis not present

## 2017-06-02 DIAGNOSIS — F321 Major depressive disorder, single episode, moderate: Secondary | ICD-10-CM | POA: Diagnosis not present

## 2017-06-09 DIAGNOSIS — L89610 Pressure ulcer of right heel, unstageable: Secondary | ICD-10-CM | POA: Diagnosis not present

## 2017-06-09 DIAGNOSIS — F321 Major depressive disorder, single episode, moderate: Secondary | ICD-10-CM | POA: Diagnosis not present

## 2017-06-09 DIAGNOSIS — L89892 Pressure ulcer of other site, stage 2: Secondary | ICD-10-CM | POA: Diagnosis not present

## 2017-06-09 DIAGNOSIS — L97812 Non-pressure chronic ulcer of other part of right lower leg with fat layer exposed: Secondary | ICD-10-CM | POA: Diagnosis not present

## 2017-06-14 DIAGNOSIS — M79675 Pain in left toe(s): Secondary | ICD-10-CM | POA: Diagnosis not present

## 2017-06-14 DIAGNOSIS — M79674 Pain in right toe(s): Secondary | ICD-10-CM | POA: Diagnosis not present

## 2017-06-14 DIAGNOSIS — B351 Tinea unguium: Secondary | ICD-10-CM | POA: Diagnosis not present

## 2017-06-16 DIAGNOSIS — F321 Major depressive disorder, single episode, moderate: Secondary | ICD-10-CM | POA: Diagnosis not present

## 2017-06-16 DIAGNOSIS — T8189XA Other complications of procedures, not elsewhere classified, initial encounter: Secondary | ICD-10-CM | POA: Diagnosis not present

## 2017-06-16 DIAGNOSIS — S81801A Unspecified open wound, right lower leg, initial encounter: Secondary | ICD-10-CM | POA: Diagnosis not present

## 2017-06-16 DIAGNOSIS — T84629A Infection and inflammatory reaction due to internal fixation device of unspecified bone of leg, initial encounter: Secondary | ICD-10-CM | POA: Diagnosis not present

## 2017-06-23 DIAGNOSIS — T8189XD Other complications of procedures, not elsewhere classified, subsequent encounter: Secondary | ICD-10-CM | POA: Diagnosis not present

## 2017-06-23 DIAGNOSIS — F321 Major depressive disorder, single episode, moderate: Secondary | ICD-10-CM | POA: Diagnosis not present

## 2017-06-23 DIAGNOSIS — Z09 Encounter for follow-up examination after completed treatment for conditions other than malignant neoplasm: Secondary | ICD-10-CM | POA: Diagnosis not present

## 2017-06-30 DIAGNOSIS — F321 Major depressive disorder, single episode, moderate: Secondary | ICD-10-CM | POA: Diagnosis not present

## 2017-07-07 DIAGNOSIS — F321 Major depressive disorder, single episode, moderate: Secondary | ICD-10-CM | POA: Diagnosis not present

## 2017-07-11 DIAGNOSIS — F321 Major depressive disorder, single episode, moderate: Secondary | ICD-10-CM | POA: Diagnosis not present

## 2017-07-13 DIAGNOSIS — I4891 Unspecified atrial fibrillation: Secondary | ICD-10-CM | POA: Diagnosis not present

## 2017-07-13 DIAGNOSIS — N184 Chronic kidney disease, stage 4 (severe): Secondary | ICD-10-CM | POA: Diagnosis not present

## 2017-07-13 DIAGNOSIS — I129 Hypertensive chronic kidney disease with stage 1 through stage 4 chronic kidney disease, or unspecified chronic kidney disease: Secondary | ICD-10-CM | POA: Diagnosis not present

## 2017-07-13 DIAGNOSIS — F0390 Unspecified dementia without behavioral disturbance: Secondary | ICD-10-CM | POA: Diagnosis not present

## 2017-07-14 DIAGNOSIS — F321 Major depressive disorder, single episode, moderate: Secondary | ICD-10-CM | POA: Diagnosis not present

## 2017-07-21 DIAGNOSIS — F321 Major depressive disorder, single episode, moderate: Secondary | ICD-10-CM | POA: Diagnosis not present

## 2017-07-27 DIAGNOSIS — F321 Major depressive disorder, single episode, moderate: Secondary | ICD-10-CM | POA: Diagnosis not present

## 2017-08-15 IMAGING — CT CT ANKLE*R* W/O CM
3 series · 13 of 33 positions shown, 16 images · non-contrast
Comparison: Intraoperative views of the right ankle 08/21/2016.
Plain films right ankle 08/19/2016.

CLINICAL DATA: History of surgical repair of right ankle fracture
in September 2016 with chronic infection. Wound VAC placed

EXAM:
CT OF THE RIGHT ANKLE WITHOUT CONTRAST
TECHNIQUE: Multidetector CT imaging of the right ankle was performed according
to the standard protocol. Multiplanar CT image reconstructions were
also generated.

[Series 4: lfov ext 3.0 b40s · axial · 0.50mm/px · z∈[+461,+584]mm · 5 of 59 slices shown, 7 images]
[im 9/59  soft-tissue]
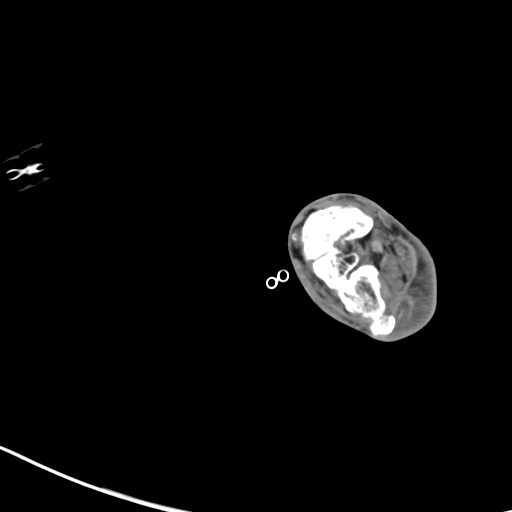
[im 9/59  bone]
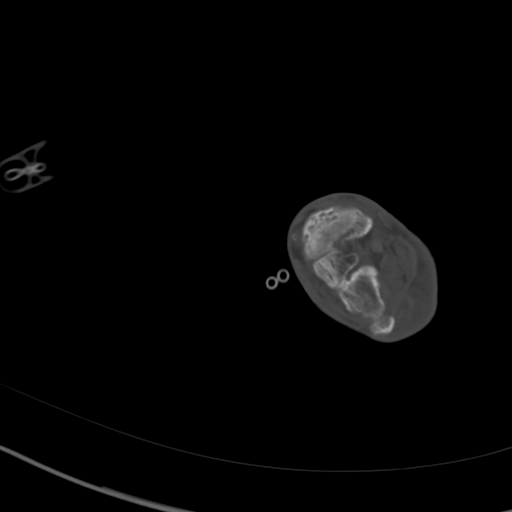
[im 18/59  bone]
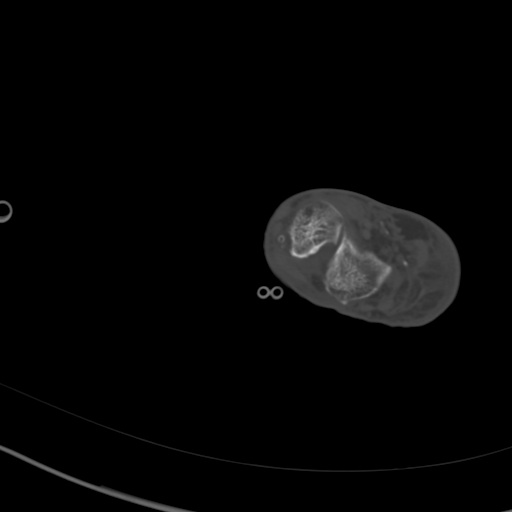
[im 32/59  bone]
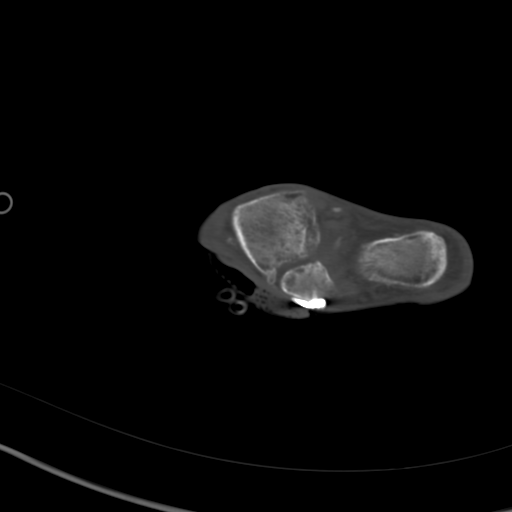
[im 41/59  bone]
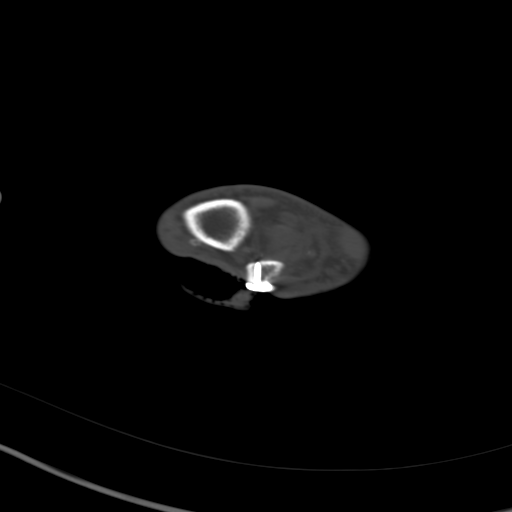
[im 50/59  soft-tissue]
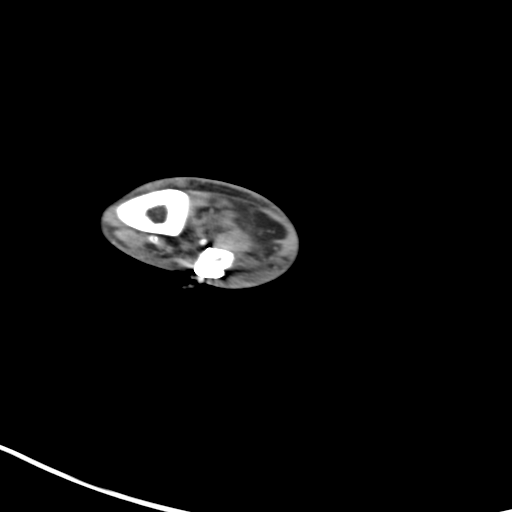
[im 50/59  bone]
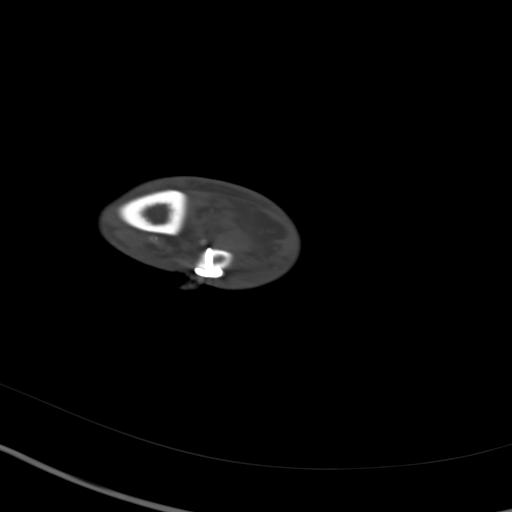

[Series 9: coronalsoft tissue · sagittal · 0.31mm/px · 5 of 118 slices shown, 6 images]
[im 30/118  bone]
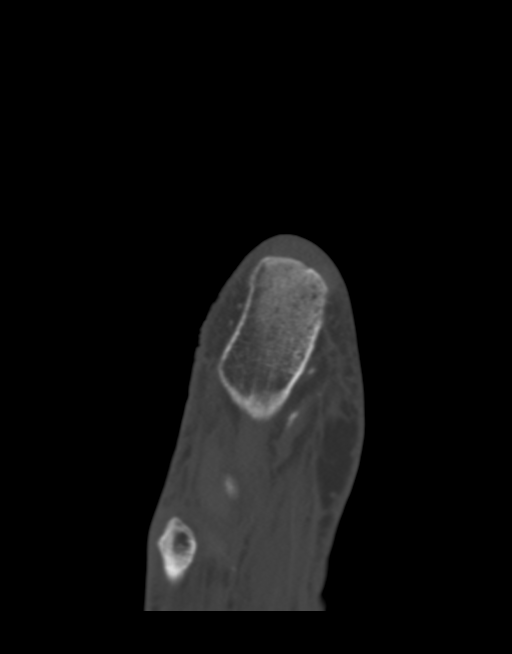
[im 40/118  bone]
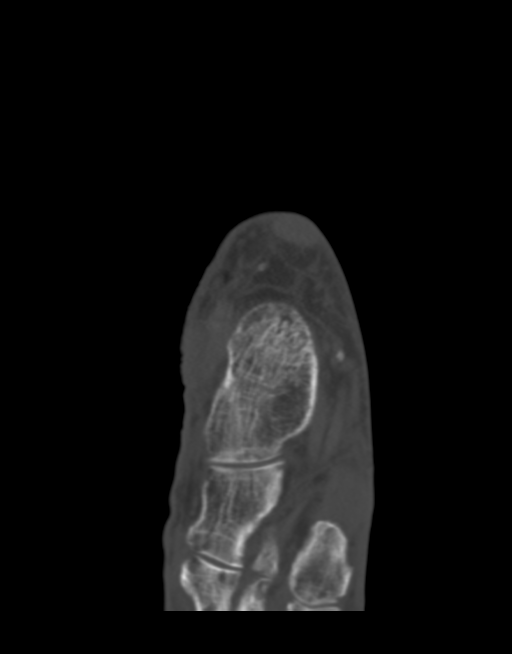
[im 49/118  bone]
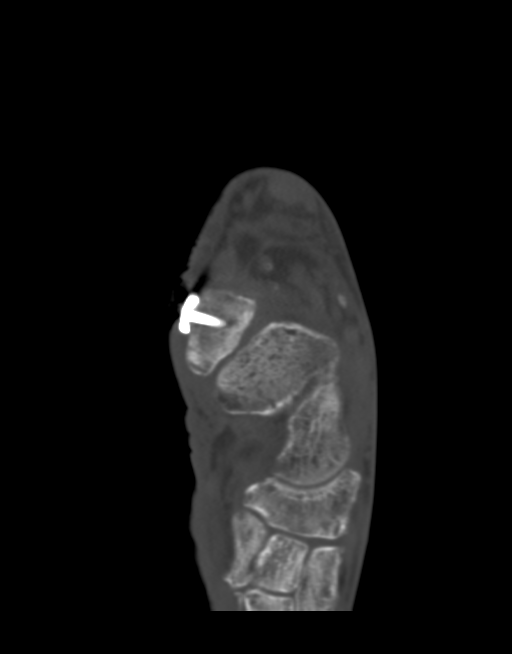
[im 59/118  soft-tissue]
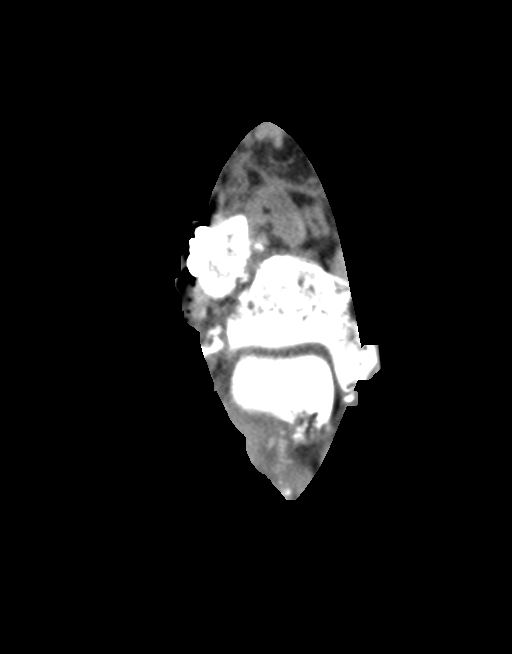
[im 59/118  bone]
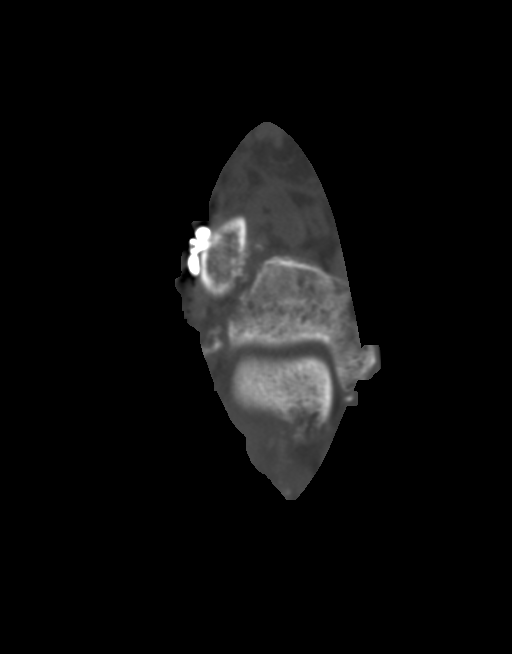
[im 69/118  bone]
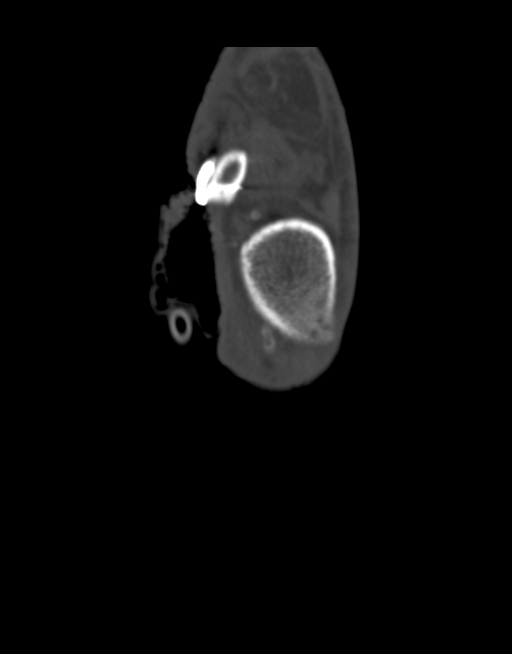

[Series 10: sagittalsoft tissue · coronal · 0.39mm/px · 3 of 126 slices shown]
[im 26/126  bone]
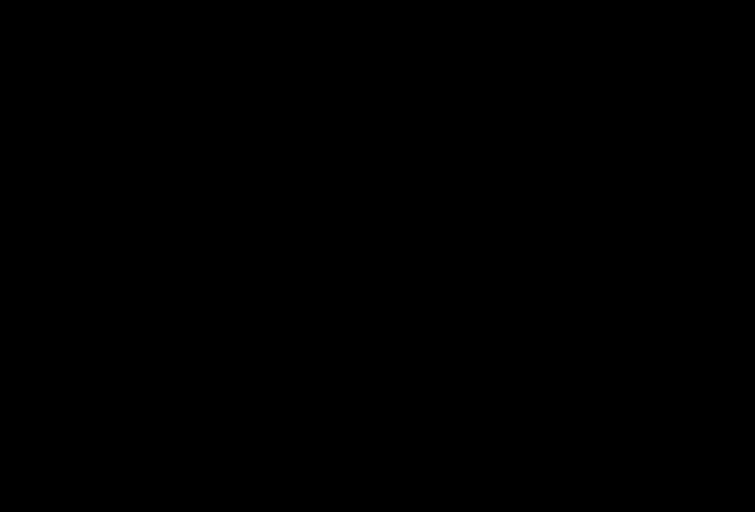
[im 51/126  bone]
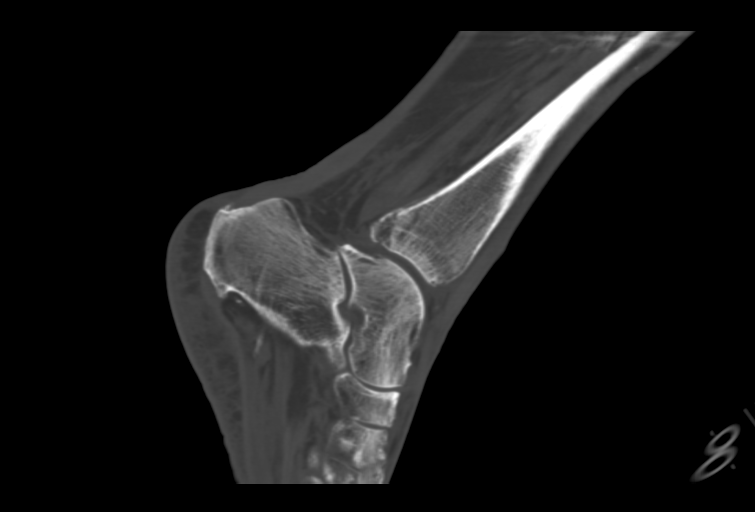
[im 76/126  bone]
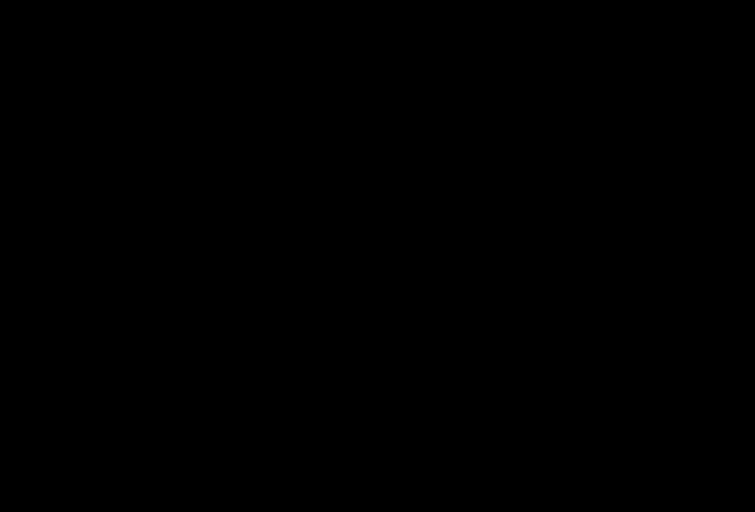

[13 of 33 positions shown; findings below may reference images not displayed]

FINDINGS: Bones/Joint/Cartilage

Plate and screws are seen fixing a distal fibular fracture. Hardware
is intact. There is no lucency about the screws. The fracture is in
near anatomic position and alignment. Fracture lines remain visible.
Bones appear diffusely osteopenic. No bony destructive change or
periosteal reaction is seen.

Ligaments

Suboptimally assessed by CT.

Muscles and Tendons

No intramuscular fluid collection is identified.  No tear.

Soft tissues

Large soft tissue wound is seen about the patient's fixation
hardware in the distal fibula. Portions of hardware and bone appear
exposed to air.
IMPRESSION: Large soft tissue wound about the distal fibula. Although no CT
evidence of osteomyelitis is identified, the patient's fixation
hardware and portions of the fibula appear exposed to air.

Negative for soft tissue abscess.

Fracture fixation hardware in the distal fibula is intact without
evidence of loosening. The fracture is in anatomic position and
alignment. Fracture lines remain visible.

## 2017-08-16 DIAGNOSIS — F0390 Unspecified dementia without behavioral disturbance: Secondary | ICD-10-CM | POA: Diagnosis not present

## 2017-08-16 DIAGNOSIS — I4891 Unspecified atrial fibrillation: Secondary | ICD-10-CM | POA: Diagnosis not present

## 2017-08-16 DIAGNOSIS — I509 Heart failure, unspecified: Secondary | ICD-10-CM | POA: Diagnosis not present

## 2017-08-16 DIAGNOSIS — Z6829 Body mass index (BMI) 29.0-29.9, adult: Secondary | ICD-10-CM | POA: Diagnosis not present

## 2017-08-22 DIAGNOSIS — F321 Major depressive disorder, single episode, moderate: Secondary | ICD-10-CM | POA: Diagnosis not present

## 2017-08-31 DIAGNOSIS — F321 Major depressive disorder, single episode, moderate: Secondary | ICD-10-CM | POA: Diagnosis not present

## 2017-09-07 DIAGNOSIS — F320 Major depressive disorder, single episode, mild: Secondary | ICD-10-CM | POA: Diagnosis not present

## 2017-09-18 DIAGNOSIS — I509 Heart failure, unspecified: Secondary | ICD-10-CM | POA: Diagnosis not present

## 2017-09-18 DIAGNOSIS — I4891 Unspecified atrial fibrillation: Secondary | ICD-10-CM | POA: Diagnosis not present

## 2017-09-18 DIAGNOSIS — E1140 Type 2 diabetes mellitus with diabetic neuropathy, unspecified: Secondary | ICD-10-CM | POA: Diagnosis not present

## 2017-09-18 DIAGNOSIS — I110 Hypertensive heart disease with heart failure: Secondary | ICD-10-CM | POA: Diagnosis not present

## 2017-09-21 DIAGNOSIS — Z79899 Other long term (current) drug therapy: Secondary | ICD-10-CM | POA: Diagnosis not present

## 2017-09-21 DIAGNOSIS — F320 Major depressive disorder, single episode, mild: Secondary | ICD-10-CM | POA: Diagnosis not present

## 2017-09-28 DIAGNOSIS — N183 Chronic kidney disease, stage 3 (moderate): Secondary | ICD-10-CM | POA: Diagnosis not present

## 2017-09-28 DIAGNOSIS — F321 Major depressive disorder, single episode, moderate: Secondary | ICD-10-CM | POA: Diagnosis not present

## 2017-10-03 DIAGNOSIS — E113293 Type 2 diabetes mellitus with mild nonproliferative diabetic retinopathy without macular edema, bilateral: Secondary | ICD-10-CM | POA: Diagnosis not present

## 2017-10-03 DIAGNOSIS — H2513 Age-related nuclear cataract, bilateral: Secondary | ICD-10-CM | POA: Diagnosis not present

## 2017-10-03 DIAGNOSIS — Z794 Long term (current) use of insulin: Secondary | ICD-10-CM | POA: Diagnosis not present

## 2017-10-05 DIAGNOSIS — F321 Major depressive disorder, single episode, moderate: Secondary | ICD-10-CM | POA: Diagnosis not present

## 2017-10-10 DIAGNOSIS — F321 Major depressive disorder, single episode, moderate: Secondary | ICD-10-CM | POA: Diagnosis not present

## 2017-10-19 DIAGNOSIS — M6281 Muscle weakness (generalized): Secondary | ICD-10-CM | POA: Diagnosis not present

## 2017-10-19 DIAGNOSIS — R1312 Dysphagia, oropharyngeal phase: Secondary | ICD-10-CM | POA: Diagnosis not present

## 2017-10-19 DIAGNOSIS — E1140 Type 2 diabetes mellitus with diabetic neuropathy, unspecified: Secondary | ICD-10-CM | POA: Diagnosis not present

## 2017-10-19 DIAGNOSIS — F321 Major depressive disorder, single episode, moderate: Secondary | ICD-10-CM | POA: Diagnosis not present

## 2017-10-19 DIAGNOSIS — J9611 Chronic respiratory failure with hypoxia: Secondary | ICD-10-CM | POA: Diagnosis not present

## 2017-10-19 DIAGNOSIS — I482 Chronic atrial fibrillation: Secondary | ICD-10-CM | POA: Diagnosis not present

## 2017-10-19 DIAGNOSIS — N183 Chronic kidney disease, stage 3 (moderate): Secondary | ICD-10-CM | POA: Diagnosis not present

## 2017-10-19 DIAGNOSIS — R293 Abnormal posture: Secondary | ICD-10-CM | POA: Diagnosis not present

## 2017-10-19 DIAGNOSIS — Z7409 Other reduced mobility: Secondary | ICD-10-CM | POA: Diagnosis not present

## 2017-10-20 DIAGNOSIS — R293 Abnormal posture: Secondary | ICD-10-CM | POA: Diagnosis not present

## 2017-10-20 DIAGNOSIS — Z7409 Other reduced mobility: Secondary | ICD-10-CM | POA: Diagnosis not present

## 2017-10-20 DIAGNOSIS — M6281 Muscle weakness (generalized): Secondary | ICD-10-CM | POA: Diagnosis not present

## 2017-10-20 DIAGNOSIS — J9611 Chronic respiratory failure with hypoxia: Secondary | ICD-10-CM | POA: Diagnosis not present

## 2017-10-20 DIAGNOSIS — N183 Chronic kidney disease, stage 3 (moderate): Secondary | ICD-10-CM | POA: Diagnosis not present

## 2017-10-20 DIAGNOSIS — E1140 Type 2 diabetes mellitus with diabetic neuropathy, unspecified: Secondary | ICD-10-CM | POA: Diagnosis not present

## 2017-10-23 DIAGNOSIS — E1140 Type 2 diabetes mellitus with diabetic neuropathy, unspecified: Secondary | ICD-10-CM | POA: Diagnosis not present

## 2017-10-23 DIAGNOSIS — J9611 Chronic respiratory failure with hypoxia: Secondary | ICD-10-CM | POA: Diagnosis not present

## 2017-10-23 DIAGNOSIS — N183 Chronic kidney disease, stage 3 (moderate): Secondary | ICD-10-CM | POA: Diagnosis not present

## 2017-10-23 DIAGNOSIS — M6281 Muscle weakness (generalized): Secondary | ICD-10-CM | POA: Diagnosis not present

## 2017-10-23 DIAGNOSIS — Z7409 Other reduced mobility: Secondary | ICD-10-CM | POA: Diagnosis not present

## 2017-10-23 DIAGNOSIS — R1312 Dysphagia, oropharyngeal phase: Secondary | ICD-10-CM | POA: Diagnosis not present

## 2017-10-23 DIAGNOSIS — R293 Abnormal posture: Secondary | ICD-10-CM | POA: Diagnosis not present

## 2017-10-23 DIAGNOSIS — I482 Chronic atrial fibrillation: Secondary | ICD-10-CM | POA: Diagnosis not present

## 2017-10-24 DIAGNOSIS — E1140 Type 2 diabetes mellitus with diabetic neuropathy, unspecified: Secondary | ICD-10-CM | POA: Diagnosis not present

## 2017-10-24 DIAGNOSIS — R293 Abnormal posture: Secondary | ICD-10-CM | POA: Diagnosis not present

## 2017-10-24 DIAGNOSIS — J9611 Chronic respiratory failure with hypoxia: Secondary | ICD-10-CM | POA: Diagnosis not present

## 2017-10-24 DIAGNOSIS — Z7409 Other reduced mobility: Secondary | ICD-10-CM | POA: Diagnosis not present

## 2017-10-24 DIAGNOSIS — N183 Chronic kidney disease, stage 3 (moderate): Secondary | ICD-10-CM | POA: Diagnosis not present

## 2017-10-24 DIAGNOSIS — M6281 Muscle weakness (generalized): Secondary | ICD-10-CM | POA: Diagnosis not present

## 2017-10-25 DIAGNOSIS — J9611 Chronic respiratory failure with hypoxia: Secondary | ICD-10-CM | POA: Diagnosis not present

## 2017-10-25 DIAGNOSIS — R293 Abnormal posture: Secondary | ICD-10-CM | POA: Diagnosis not present

## 2017-10-25 DIAGNOSIS — M6281 Muscle weakness (generalized): Secondary | ICD-10-CM | POA: Diagnosis not present

## 2017-10-25 DIAGNOSIS — Z7409 Other reduced mobility: Secondary | ICD-10-CM | POA: Diagnosis not present

## 2017-10-25 DIAGNOSIS — E1140 Type 2 diabetes mellitus with diabetic neuropathy, unspecified: Secondary | ICD-10-CM | POA: Diagnosis not present

## 2017-10-25 DIAGNOSIS — N183 Chronic kidney disease, stage 3 (moderate): Secondary | ICD-10-CM | POA: Diagnosis not present

## 2017-10-26 DIAGNOSIS — R293 Abnormal posture: Secondary | ICD-10-CM | POA: Diagnosis not present

## 2017-10-26 DIAGNOSIS — J9611 Chronic respiratory failure with hypoxia: Secondary | ICD-10-CM | POA: Diagnosis not present

## 2017-10-26 DIAGNOSIS — M6281 Muscle weakness (generalized): Secondary | ICD-10-CM | POA: Diagnosis not present

## 2017-10-26 DIAGNOSIS — F321 Major depressive disorder, single episode, moderate: Secondary | ICD-10-CM | POA: Diagnosis not present

## 2017-10-26 DIAGNOSIS — E1140 Type 2 diabetes mellitus with diabetic neuropathy, unspecified: Secondary | ICD-10-CM | POA: Diagnosis not present

## 2017-10-26 DIAGNOSIS — N183 Chronic kidney disease, stage 3 (moderate): Secondary | ICD-10-CM | POA: Diagnosis not present

## 2017-10-26 DIAGNOSIS — Z7409 Other reduced mobility: Secondary | ICD-10-CM | POA: Diagnosis not present

## 2017-10-27 DIAGNOSIS — J9611 Chronic respiratory failure with hypoxia: Secondary | ICD-10-CM | POA: Diagnosis not present

## 2017-10-27 DIAGNOSIS — Z7409 Other reduced mobility: Secondary | ICD-10-CM | POA: Diagnosis not present

## 2017-10-27 DIAGNOSIS — E1140 Type 2 diabetes mellitus with diabetic neuropathy, unspecified: Secondary | ICD-10-CM | POA: Diagnosis not present

## 2017-10-27 DIAGNOSIS — M6281 Muscle weakness (generalized): Secondary | ICD-10-CM | POA: Diagnosis not present

## 2017-10-27 DIAGNOSIS — N183 Chronic kidney disease, stage 3 (moderate): Secondary | ICD-10-CM | POA: Diagnosis not present

## 2017-10-27 DIAGNOSIS — R293 Abnormal posture: Secondary | ICD-10-CM | POA: Diagnosis not present

## 2017-10-28 DIAGNOSIS — Z7409 Other reduced mobility: Secondary | ICD-10-CM | POA: Diagnosis not present

## 2017-10-28 DIAGNOSIS — J9611 Chronic respiratory failure with hypoxia: Secondary | ICD-10-CM | POA: Diagnosis not present

## 2017-10-28 DIAGNOSIS — R293 Abnormal posture: Secondary | ICD-10-CM | POA: Diagnosis not present

## 2017-10-28 DIAGNOSIS — N183 Chronic kidney disease, stage 3 (moderate): Secondary | ICD-10-CM | POA: Diagnosis not present

## 2017-10-28 DIAGNOSIS — E1140 Type 2 diabetes mellitus with diabetic neuropathy, unspecified: Secondary | ICD-10-CM | POA: Diagnosis not present

## 2017-10-28 DIAGNOSIS — M6281 Muscle weakness (generalized): Secondary | ICD-10-CM | POA: Diagnosis not present

## 2017-10-31 DIAGNOSIS — Z7409 Other reduced mobility: Secondary | ICD-10-CM | POA: Diagnosis not present

## 2017-10-31 DIAGNOSIS — J9611 Chronic respiratory failure with hypoxia: Secondary | ICD-10-CM | POA: Diagnosis not present

## 2017-10-31 DIAGNOSIS — R293 Abnormal posture: Secondary | ICD-10-CM | POA: Diagnosis not present

## 2017-10-31 DIAGNOSIS — E1140 Type 2 diabetes mellitus with diabetic neuropathy, unspecified: Secondary | ICD-10-CM | POA: Diagnosis not present

## 2017-10-31 DIAGNOSIS — N183 Chronic kidney disease, stage 3 (moderate): Secondary | ICD-10-CM | POA: Diagnosis not present

## 2017-10-31 DIAGNOSIS — M6281 Muscle weakness (generalized): Secondary | ICD-10-CM | POA: Diagnosis not present

## 2017-11-01 DIAGNOSIS — E1140 Type 2 diabetes mellitus with diabetic neuropathy, unspecified: Secondary | ICD-10-CM | POA: Diagnosis not present

## 2017-11-01 DIAGNOSIS — R293 Abnormal posture: Secondary | ICD-10-CM | POA: Diagnosis not present

## 2017-11-01 DIAGNOSIS — N183 Chronic kidney disease, stage 3 (moderate): Secondary | ICD-10-CM | POA: Diagnosis not present

## 2017-11-01 DIAGNOSIS — J9611 Chronic respiratory failure with hypoxia: Secondary | ICD-10-CM | POA: Diagnosis not present

## 2017-11-01 DIAGNOSIS — M6281 Muscle weakness (generalized): Secondary | ICD-10-CM | POA: Diagnosis not present

## 2017-11-01 DIAGNOSIS — Z7409 Other reduced mobility: Secondary | ICD-10-CM | POA: Diagnosis not present

## 2017-11-02 DIAGNOSIS — F321 Major depressive disorder, single episode, moderate: Secondary | ICD-10-CM | POA: Diagnosis not present

## 2017-11-03 DIAGNOSIS — R1310 Dysphagia, unspecified: Secondary | ICD-10-CM | POA: Diagnosis not present

## 2017-11-03 DIAGNOSIS — K219 Gastro-esophageal reflux disease without esophagitis: Secondary | ICD-10-CM | POA: Diagnosis not present

## 2017-11-07 DIAGNOSIS — F321 Major depressive disorder, single episode, moderate: Secondary | ICD-10-CM | POA: Diagnosis not present

## 2017-11-15 DIAGNOSIS — I509 Heart failure, unspecified: Secondary | ICD-10-CM | POA: Diagnosis not present

## 2017-11-15 DIAGNOSIS — F0390 Unspecified dementia without behavioral disturbance: Secondary | ICD-10-CM | POA: Diagnosis not present

## 2017-11-15 DIAGNOSIS — I110 Hypertensive heart disease with heart failure: Secondary | ICD-10-CM | POA: Diagnosis not present

## 2017-11-15 DIAGNOSIS — E1140 Type 2 diabetes mellitus with diabetic neuropathy, unspecified: Secondary | ICD-10-CM | POA: Diagnosis not present

## 2017-11-23 DIAGNOSIS — F411 Generalized anxiety disorder: Secondary | ICD-10-CM | POA: Diagnosis not present

## 2017-11-23 DIAGNOSIS — F321 Major depressive disorder, single episode, moderate: Secondary | ICD-10-CM | POA: Diagnosis not present

## 2017-12-07 DIAGNOSIS — F321 Major depressive disorder, single episode, moderate: Secondary | ICD-10-CM | POA: Diagnosis not present

## 2017-12-08 DIAGNOSIS — L89519 Pressure ulcer of right ankle, unspecified stage: Secondary | ICD-10-CM | POA: Diagnosis not present

## 2017-12-14 DIAGNOSIS — F321 Major depressive disorder, single episode, moderate: Secondary | ICD-10-CM | POA: Diagnosis not present

## 2017-12-19 DIAGNOSIS — F321 Major depressive disorder, single episode, moderate: Secondary | ICD-10-CM | POA: Diagnosis not present

## 2017-12-20 DIAGNOSIS — M79675 Pain in left toe(s): Secondary | ICD-10-CM | POA: Diagnosis not present

## 2017-12-20 DIAGNOSIS — M79674 Pain in right toe(s): Secondary | ICD-10-CM | POA: Diagnosis not present

## 2017-12-20 DIAGNOSIS — B351 Tinea unguium: Secondary | ICD-10-CM | POA: Diagnosis not present

## 2017-12-21 DIAGNOSIS — F321 Major depressive disorder, single episode, moderate: Secondary | ICD-10-CM | POA: Diagnosis not present

## 2017-12-25 DIAGNOSIS — F0390 Unspecified dementia without behavioral disturbance: Secondary | ICD-10-CM | POA: Diagnosis not present

## 2017-12-25 DIAGNOSIS — E1140 Type 2 diabetes mellitus with diabetic neuropathy, unspecified: Secondary | ICD-10-CM | POA: Diagnosis not present

## 2017-12-25 DIAGNOSIS — I4891 Unspecified atrial fibrillation: Secondary | ICD-10-CM | POA: Diagnosis not present

## 2017-12-25 DIAGNOSIS — I110 Hypertensive heart disease with heart failure: Secondary | ICD-10-CM | POA: Diagnosis not present

## 2017-12-28 DIAGNOSIS — F321 Major depressive disorder, single episode, moderate: Secondary | ICD-10-CM | POA: Diagnosis not present

## 2018-01-11 DIAGNOSIS — F321 Major depressive disorder, single episode, moderate: Secondary | ICD-10-CM | POA: Diagnosis not present

## 2018-01-15 DIAGNOSIS — I110 Hypertensive heart disease with heart failure: Secondary | ICD-10-CM | POA: Diagnosis not present

## 2018-01-16 DIAGNOSIS — Z79899 Other long term (current) drug therapy: Secondary | ICD-10-CM | POA: Diagnosis not present

## 2018-01-18 DIAGNOSIS — F321 Major depressive disorder, single episode, moderate: Secondary | ICD-10-CM | POA: Diagnosis not present

## 2018-01-26 DIAGNOSIS — F0390 Unspecified dementia without behavioral disturbance: Secondary | ICD-10-CM | POA: Diagnosis not present

## 2018-01-26 DIAGNOSIS — I509 Heart failure, unspecified: Secondary | ICD-10-CM | POA: Diagnosis not present

## 2018-01-26 DIAGNOSIS — E1140 Type 2 diabetes mellitus with diabetic neuropathy, unspecified: Secondary | ICD-10-CM | POA: Diagnosis not present

## 2018-01-26 DIAGNOSIS — I110 Hypertensive heart disease with heart failure: Secondary | ICD-10-CM | POA: Diagnosis not present

## 2018-02-06 DIAGNOSIS — F321 Major depressive disorder, single episode, moderate: Secondary | ICD-10-CM | POA: Diagnosis not present

## 2018-02-22 DIAGNOSIS — F321 Major depressive disorder, single episode, moderate: Secondary | ICD-10-CM | POA: Diagnosis not present

## 2018-03-01 DIAGNOSIS — Z743 Need for continuous supervision: Secondary | ICD-10-CM | POA: Diagnosis not present

## 2018-03-01 DIAGNOSIS — R279 Unspecified lack of coordination: Secondary | ICD-10-CM | POA: Diagnosis not present

## 2018-03-07 DIAGNOSIS — M79675 Pain in left toe(s): Secondary | ICD-10-CM | POA: Diagnosis not present

## 2018-03-07 DIAGNOSIS — B351 Tinea unguium: Secondary | ICD-10-CM | POA: Diagnosis not present

## 2018-03-07 DIAGNOSIS — M79674 Pain in right toe(s): Secondary | ICD-10-CM | POA: Diagnosis not present

## 2018-03-19 DIAGNOSIS — E1140 Type 2 diabetes mellitus with diabetic neuropathy, unspecified: Secondary | ICD-10-CM | POA: Diagnosis not present

## 2018-03-19 DIAGNOSIS — I509 Heart failure, unspecified: Secondary | ICD-10-CM | POA: Diagnosis not present

## 2018-03-19 DIAGNOSIS — I110 Hypertensive heart disease with heart failure: Secondary | ICD-10-CM | POA: Diagnosis not present

## 2018-03-19 DIAGNOSIS — F0390 Unspecified dementia without behavioral disturbance: Secondary | ICD-10-CM | POA: Diagnosis not present

## 2018-05-17 DIAGNOSIS — R05 Cough: Secondary | ICD-10-CM | POA: Diagnosis not present

## 2018-05-17 DIAGNOSIS — H908 Mixed conductive and sensorineural hearing loss, unspecified: Secondary | ICD-10-CM | POA: Diagnosis not present

## 2018-05-17 DIAGNOSIS — Z79899 Other long term (current) drug therapy: Secondary | ICD-10-CM | POA: Diagnosis not present

## 2018-05-17 DIAGNOSIS — H903 Sensorineural hearing loss, bilateral: Secondary | ICD-10-CM | POA: Diagnosis not present

## 2018-05-17 DIAGNOSIS — H9193 Unspecified hearing loss, bilateral: Secondary | ICD-10-CM | POA: Diagnosis not present

## 2018-05-21 DIAGNOSIS — Z139 Encounter for screening, unspecified: Secondary | ICD-10-CM | POA: Diagnosis not present

## 2018-05-21 DIAGNOSIS — F0390 Unspecified dementia without behavioral disturbance: Secondary | ICD-10-CM | POA: Diagnosis not present

## 2018-05-21 DIAGNOSIS — Z9181 History of falling: Secondary | ICD-10-CM | POA: Diagnosis not present

## 2018-05-21 DIAGNOSIS — Z0000 Encounter for general adult medical examination without abnormal findings: Secondary | ICD-10-CM | POA: Diagnosis not present

## 2018-06-06 DIAGNOSIS — I4891 Unspecified atrial fibrillation: Secondary | ICD-10-CM | POA: Diagnosis not present

## 2018-06-06 DIAGNOSIS — I509 Heart failure, unspecified: Secondary | ICD-10-CM | POA: Diagnosis not present

## 2018-06-06 DIAGNOSIS — E1140 Type 2 diabetes mellitus with diabetic neuropathy, unspecified: Secondary | ICD-10-CM | POA: Diagnosis not present

## 2018-06-06 DIAGNOSIS — E1129 Type 2 diabetes mellitus with other diabetic kidney complication: Secondary | ICD-10-CM | POA: Diagnosis not present

## 2018-08-27 DIAGNOSIS — E1140 Type 2 diabetes mellitus with diabetic neuropathy, unspecified: Secondary | ICD-10-CM | POA: Diagnosis not present

## 2018-08-27 DIAGNOSIS — E1129 Type 2 diabetes mellitus with other diabetic kidney complication: Secondary | ICD-10-CM | POA: Diagnosis not present

## 2018-08-27 DIAGNOSIS — I509 Heart failure, unspecified: Secondary | ICD-10-CM | POA: Diagnosis not present

## 2018-08-27 DIAGNOSIS — I110 Hypertensive heart disease with heart failure: Secondary | ICD-10-CM | POA: Diagnosis not present

## 2022-04-21 DEATH — deceased
